# Patient Record
Sex: Female | Born: 1975 | Race: Black or African American | Hispanic: No | Marital: Single | Smoking: Never smoker
Health system: Southern US, Community
[De-identification: ages and names within clinical notes are randomized; demographics above are authoritative.]

## PROBLEM LIST (undated history)

## (undated) DIAGNOSIS — Z923 Personal history of irradiation: Secondary | ICD-10-CM

---

## 2003-10-10 ENCOUNTER — Ambulatory Visit (HOSPITAL_COMMUNITY): Admission: RE | Admit: 2003-10-10 | Discharge: 2003-10-10 | Payer: Self-pay | Admitting: Obstetrics & Gynecology

## 2008-01-25 HISTORY — PX: PARTIAL HYSTERECTOMY: SHX80

## 2008-01-25 HISTORY — PX: MYOMECTOMY: SHX85

## 2009-04-28 ENCOUNTER — Ambulatory Visit (HOSPITAL_COMMUNITY): Admission: RE | Admit: 2009-04-28 | Discharge: 2009-04-28 | Payer: Self-pay | Admitting: Family Medicine

## 2009-04-30 ENCOUNTER — Encounter: Admission: RE | Admit: 2009-04-30 | Discharge: 2009-04-30 | Payer: Self-pay | Admitting: Family Medicine

## 2009-07-01 ENCOUNTER — Ambulatory Visit: Payer: Self-pay | Admitting: Obstetrics & Gynecology

## 2009-08-06 ENCOUNTER — Ambulatory Visit: Payer: Self-pay | Admitting: Obstetrics and Gynecology

## 2009-08-31 ENCOUNTER — Ambulatory Visit (HOSPITAL_COMMUNITY): Admission: RE | Admit: 2009-08-31 | Discharge: 2009-08-31 | Payer: Self-pay | Admitting: Family Medicine

## 2009-09-02 ENCOUNTER — Ambulatory Visit: Payer: Self-pay | Admitting: Obstetrics & Gynecology

## 2009-10-19 ENCOUNTER — Inpatient Hospital Stay (HOSPITAL_COMMUNITY): Admission: RE | Admit: 2009-10-19 | Discharge: 2009-10-20 | Payer: Self-pay | Admitting: Obstetrics & Gynecology

## 2009-10-19 ENCOUNTER — Encounter: Payer: Self-pay | Admitting: Obstetrics & Gynecology

## 2009-10-19 ENCOUNTER — Ambulatory Visit: Payer: Self-pay | Admitting: Obstetrics & Gynecology

## 2009-11-20 ENCOUNTER — Ambulatory Visit: Payer: Self-pay | Admitting: Obstetrics & Gynecology

## 2010-02-15 ENCOUNTER — Encounter: Payer: Self-pay | Admitting: Family Medicine

## 2010-04-08 LAB — CBC
HCT: 34.8 % — ABNORMAL LOW (ref 36.0–46.0)
MCH: 27 pg (ref 26.0–34.0)
MCH: 28 pg (ref 26.0–34.0)
Platelets: 206 10*3/uL (ref 150–400)
Platelets: 285 10*3/uL (ref 150–400)
RBC: 4.23 MIL/uL (ref 3.87–5.11)
WBC: 3.7 10*3/uL — ABNORMAL LOW (ref 4.0–10.5)

## 2010-04-08 LAB — SURGICAL PCR SCREEN
MRSA, PCR: NEGATIVE
Staphylococcus aureus: NEGATIVE

## 2010-04-08 LAB — PREGNANCY, URINE: Preg Test, Ur: NEGATIVE

## 2010-09-06 IMAGING — US US PELVIS COMPLETE MODIFY
1 series · 14 of 25 positions shown · non-contrast
Comparison: October 10, 2003

CLINICAL DATA: Enlarged uterus and abnormal bleeding; expired IUD
with nonvisualization of strings

TRANSABDOMINAL AND TRANSVAGINAL ULTRASOUND OF PELVIS
TECHNIQUE: Both transabdominal and transvaginal ultrasound
examinations of the pelvis were performed including evaluation of
the uterus, ovaries, adnexal regions, and pelvic cul-de-sac.

[Series 1: us pelvis complete modify · 0.24mm/px · 14 of 61 slices shown]
[im 1/61]
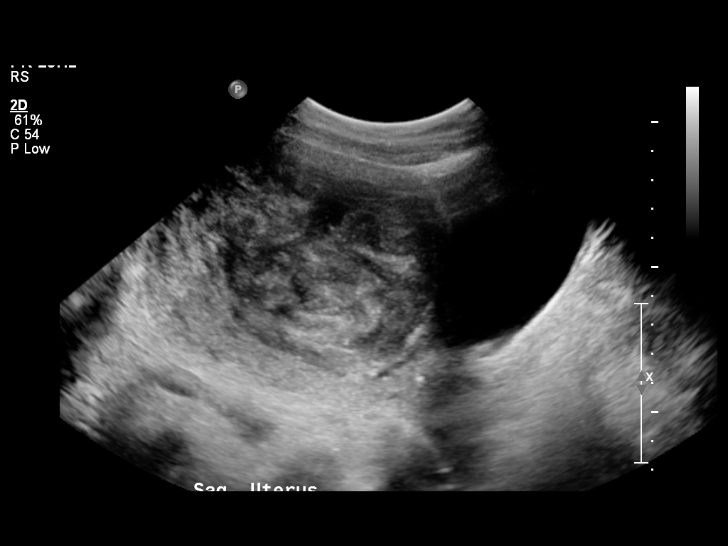
[im 6/61]
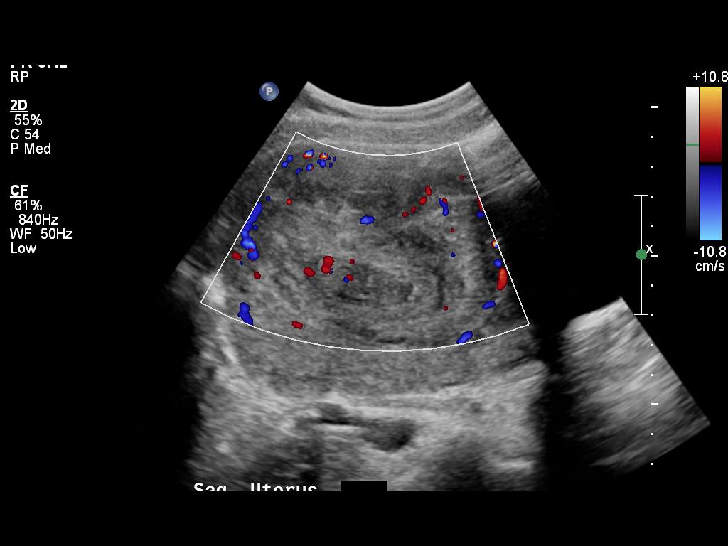
[im 11/61]
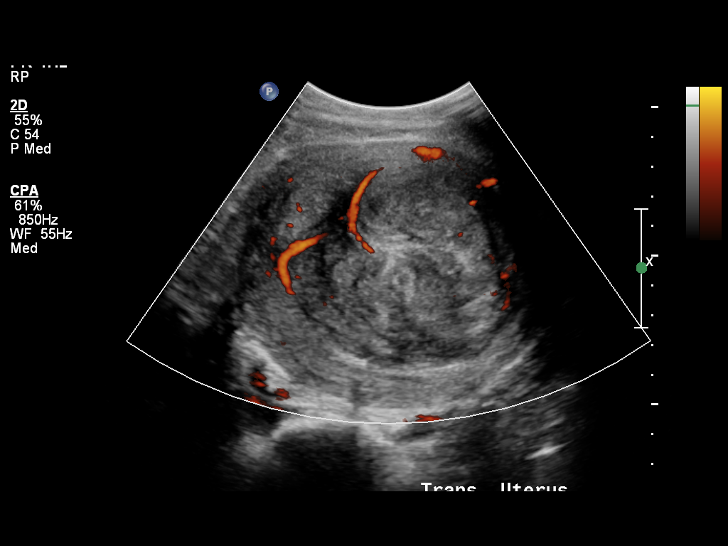
[im 16/61]
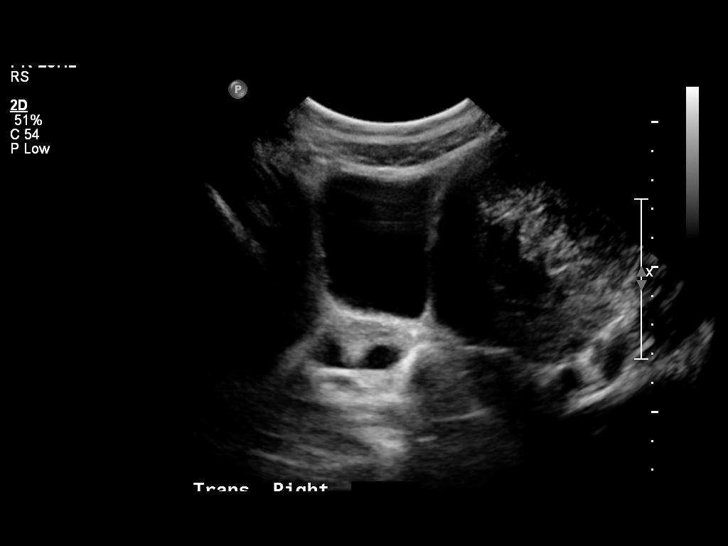
[im 21/61]
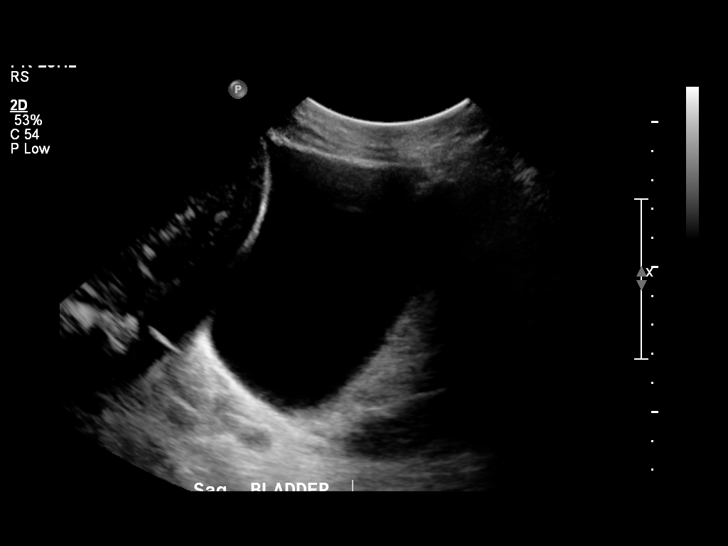
[im 23/61]
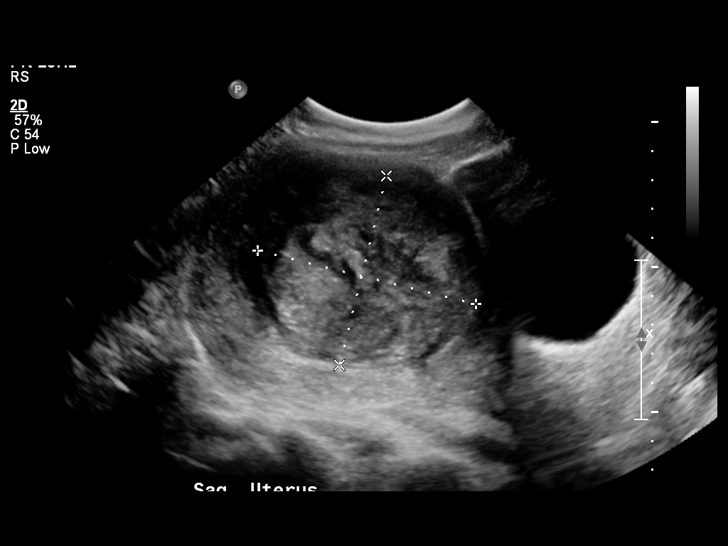
[im 28/61]
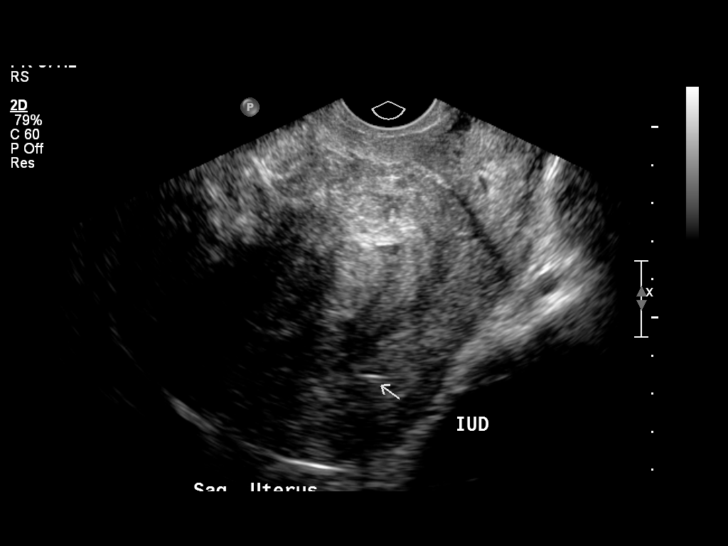
[im 33/61]
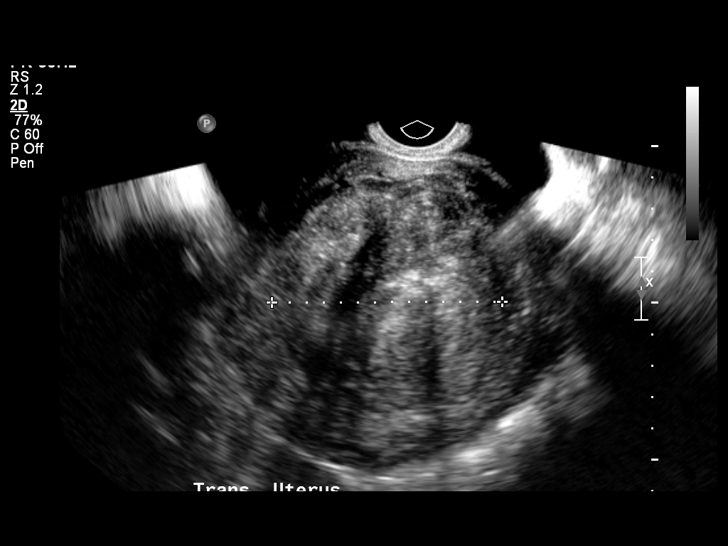
[im 38/61]
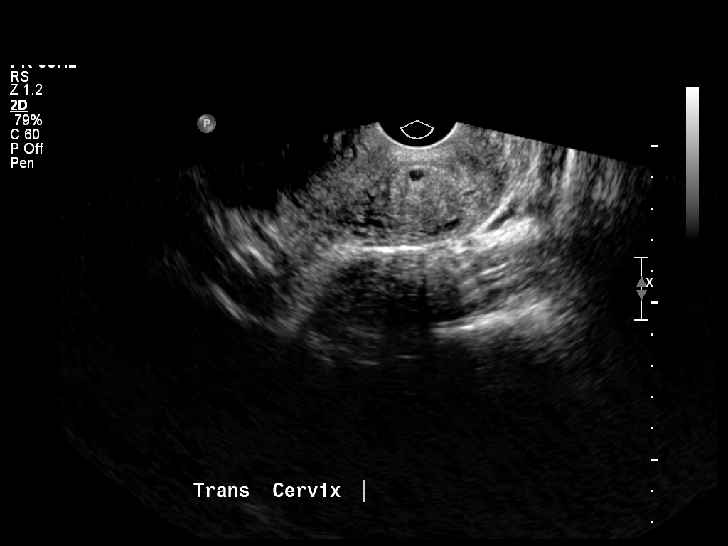
[im 41/61]
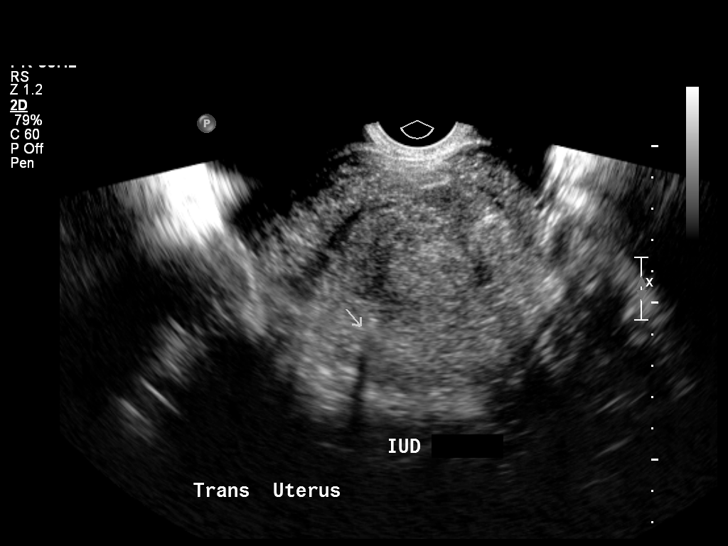
[im 46/61]
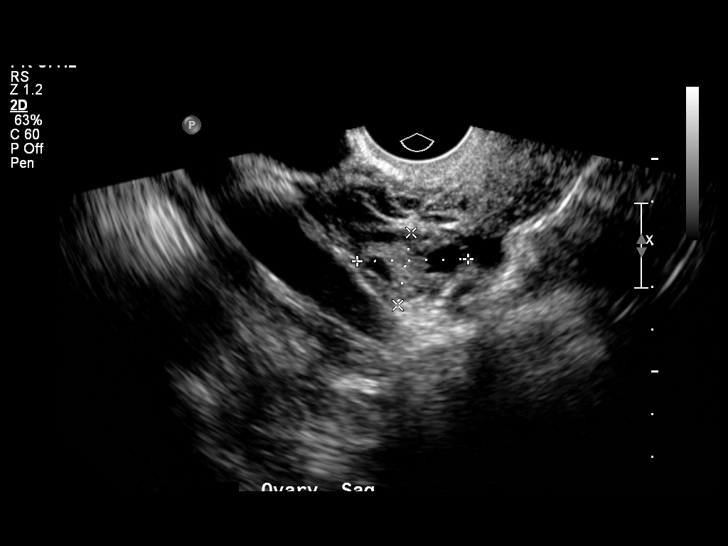
[im 51/61]
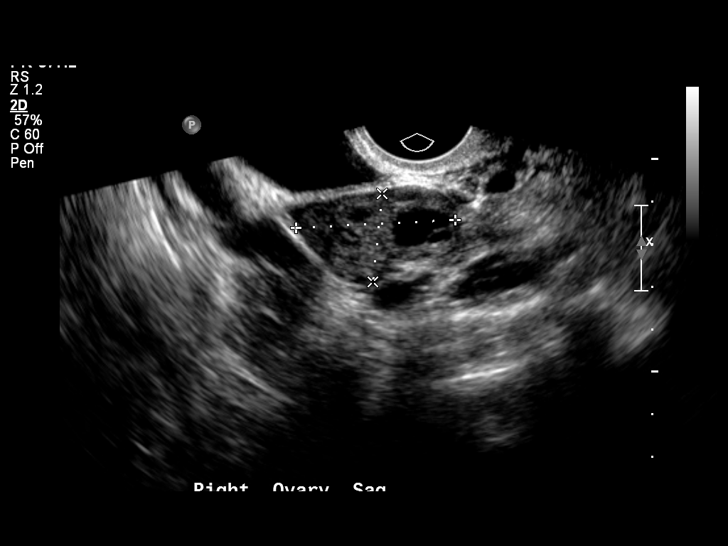
[im 56/61]
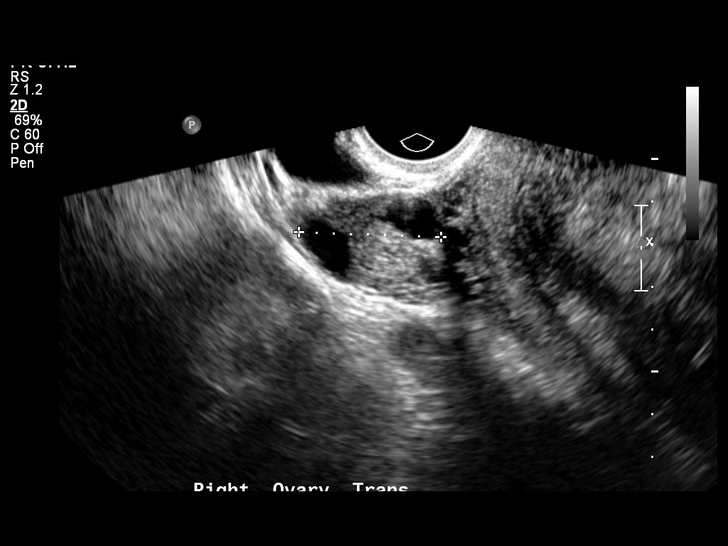
[im 61/61]
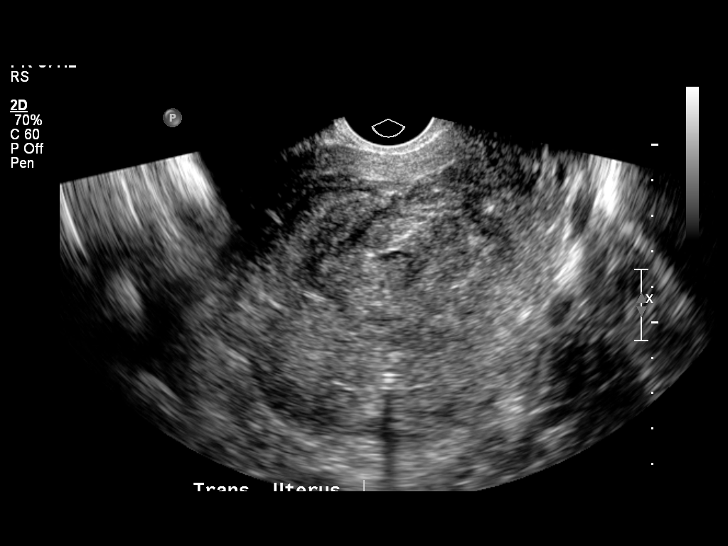

[14 of 25 positions shown; findings below may reference images not displayed]

FINDINGS: The uterus is enlarged, measuring 14.7 x 8.5 x 10.7 cm.
There is a large anterior myometrial fibroid which measures 8.7 cm
in dimension.  This displaces the endometrium posteriorly.  The
endometrium is difficult to see but the IUD is identified within
the endometrial cavity posterior to the large fibroid.

Both ovaries have a normal size and appearance.  The right ovary
measures 3.7 x 2.1 x 2.2 cm, and the left ovary measures 2.6 x
x 2.2 cm.  There are no adnexal masses or free pelvic fluid.
IMPRESSION: 8.7 cm anterior fundal myometrial fibroid.

The IUD is in proper location within the endometrial cavity which
is displaced posteriorly by the large fibroid.

## 2011-01-09 IMAGING — US US TRANSVAGINAL NON-OB
1 series · 13 of 25 positions shown · non-contrast
Comparison: 04/28/2009

CLINICAL DATA: Menorrhagia.  Uterine fibroids.  Retained IUD.

TRANSVAGINAL ULTRASOUND OF PELVIS
TECHNIQUE: Transvaginal ultrasound examination of the pelvis was
performed including evaluation of the uterus, ovaries, adnexal
regions, and pelvic cul-de-sac.

[Series 1: us transvaginal non-ob · 36 acquisitions, 13 frames shown]
[im 1/36]
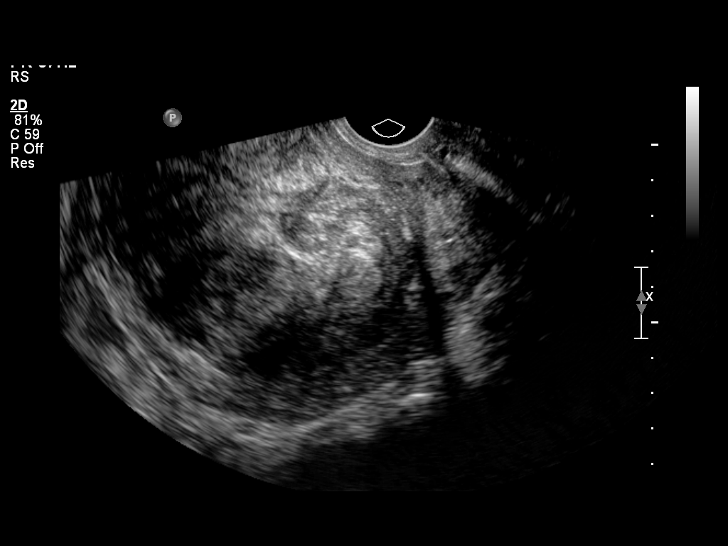
[im 3/36]
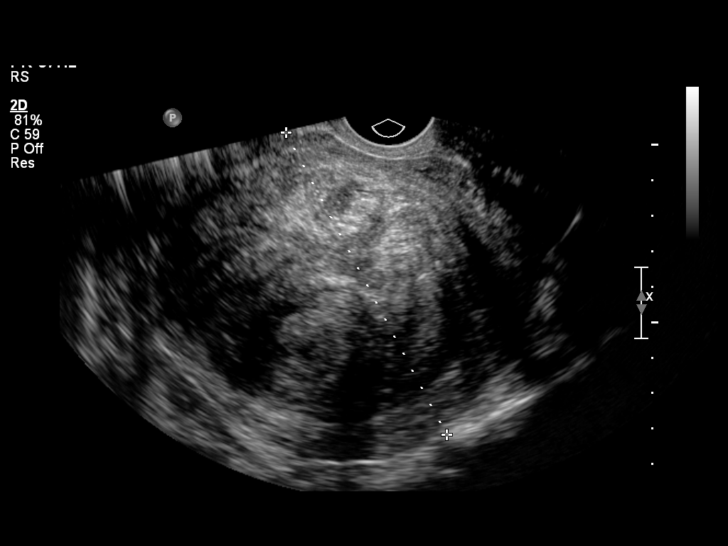
[im 6/36]
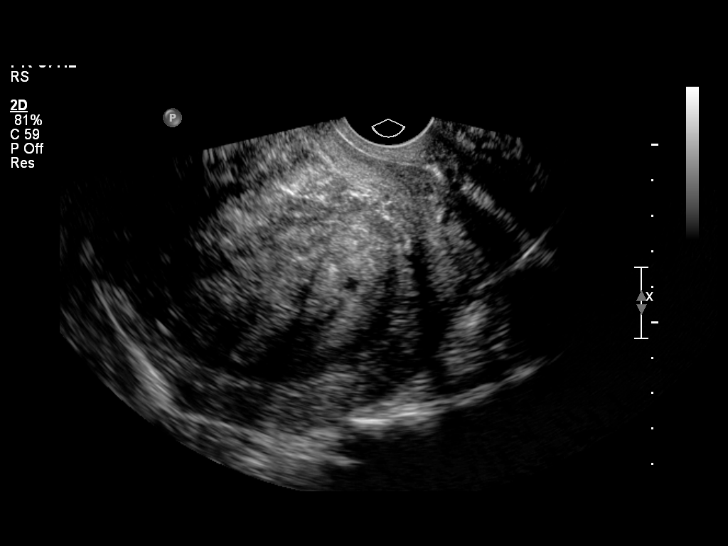
[im 9/36]
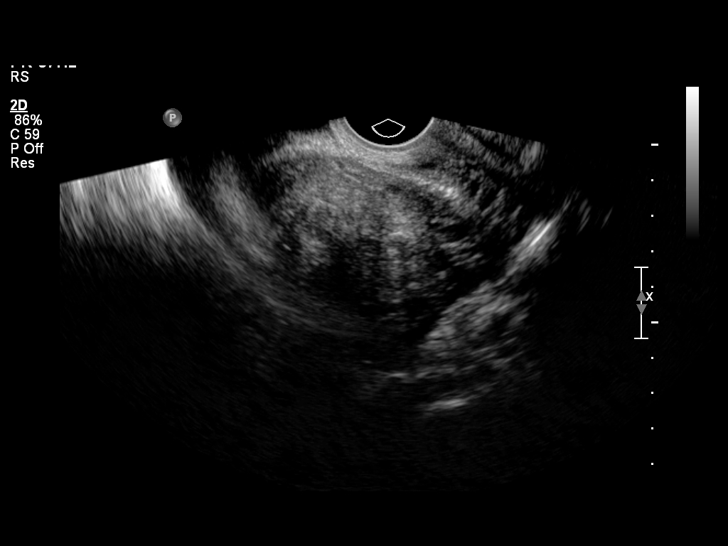
[im 12/36]
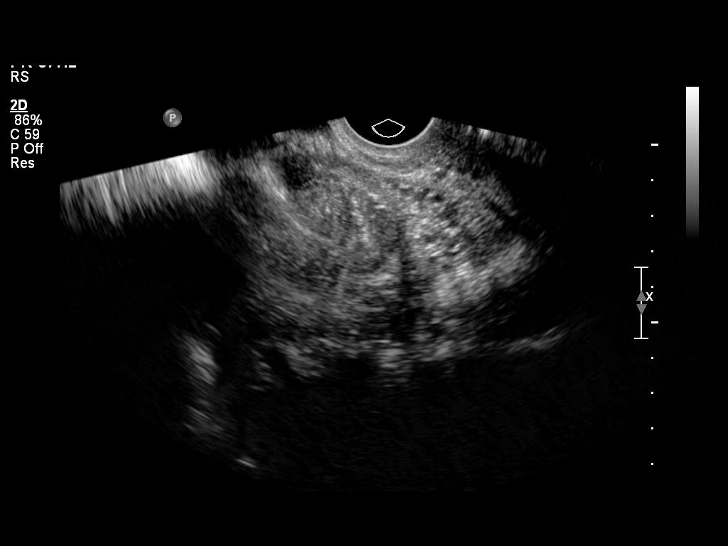
[im 15/36]
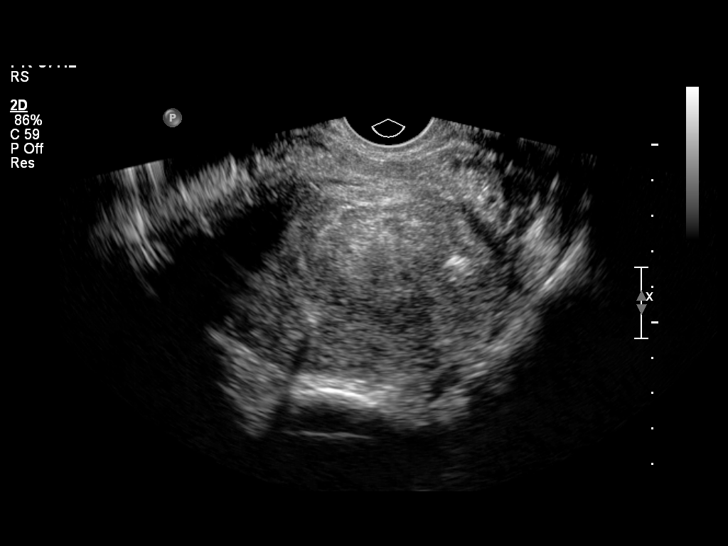
[im 18/36]
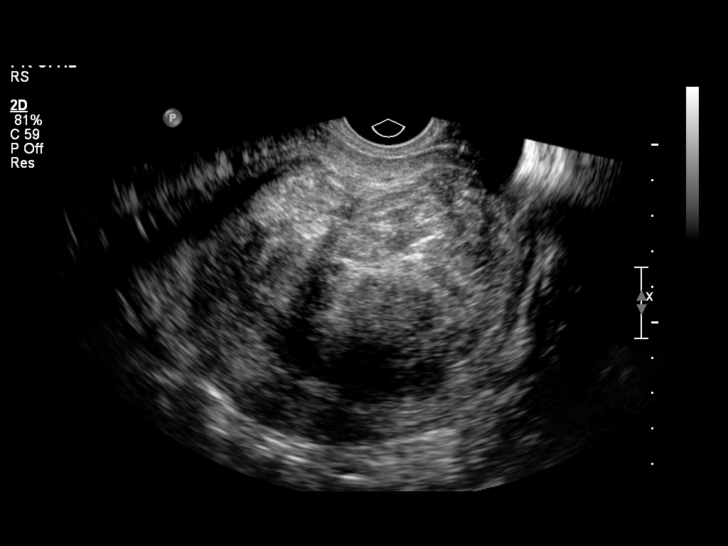
[im 21/36]
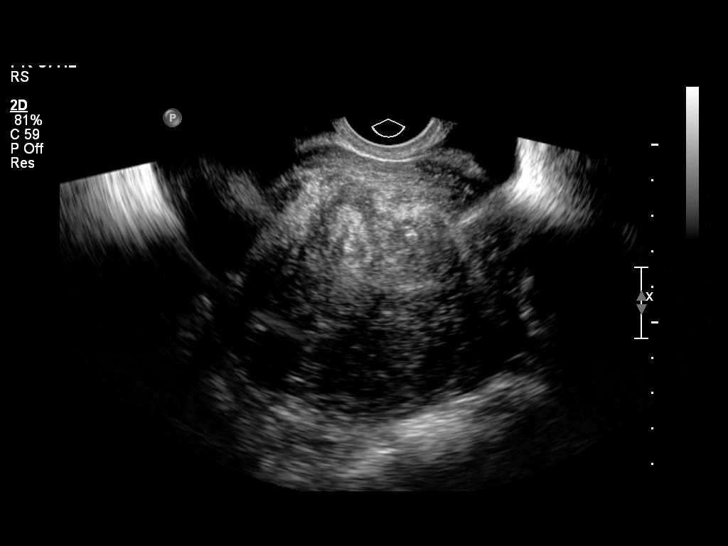
[im 24/36]
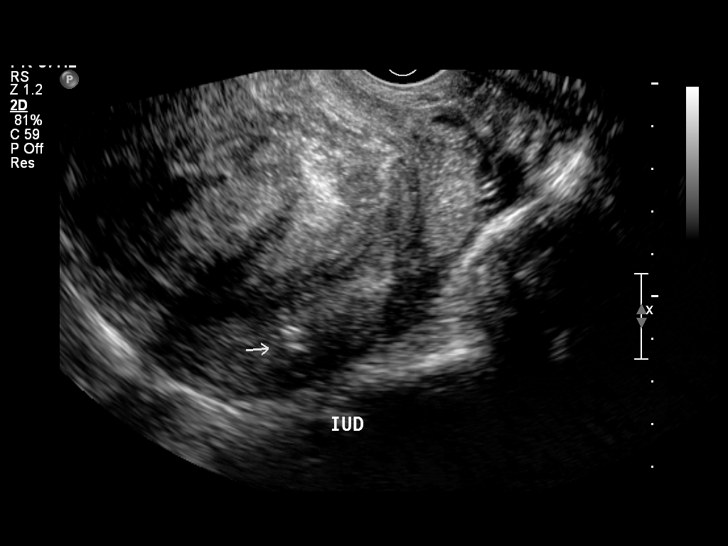
[im 27/36]
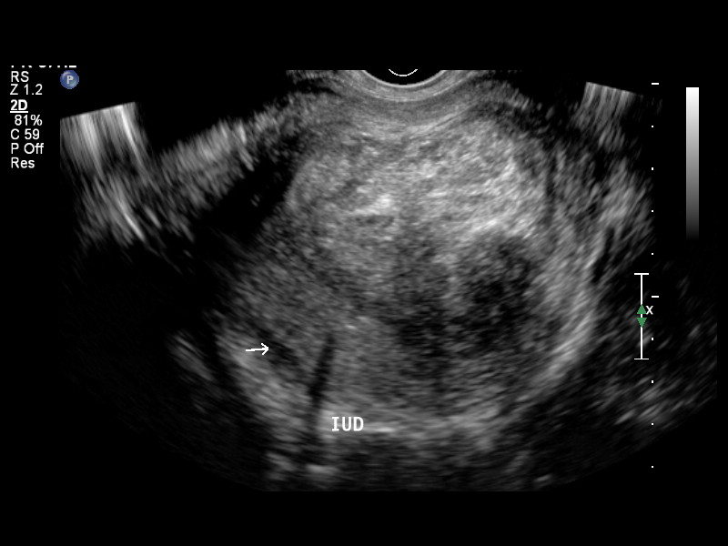
[im 30/36]
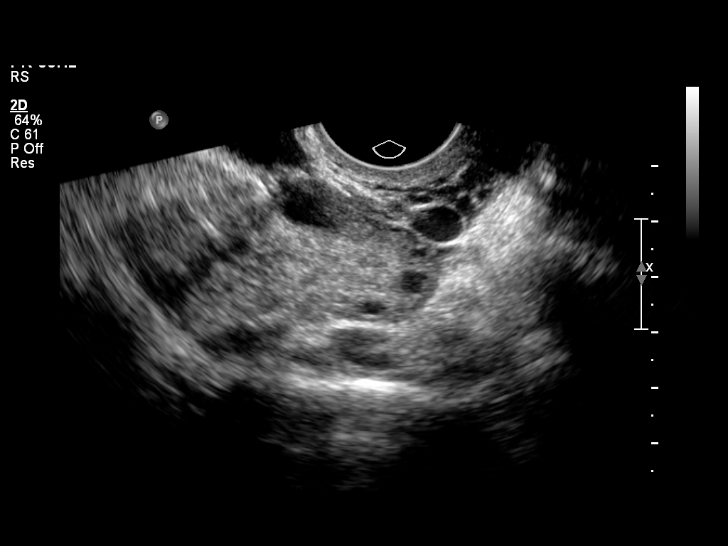
[im 33/36]
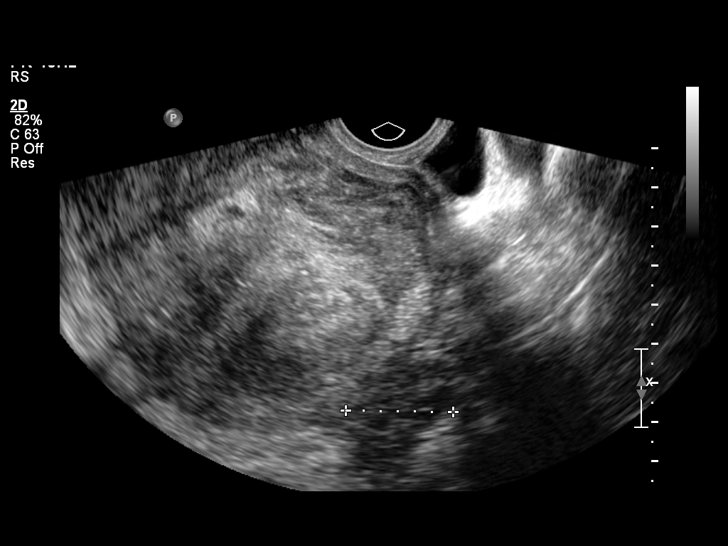
[im 36/36]
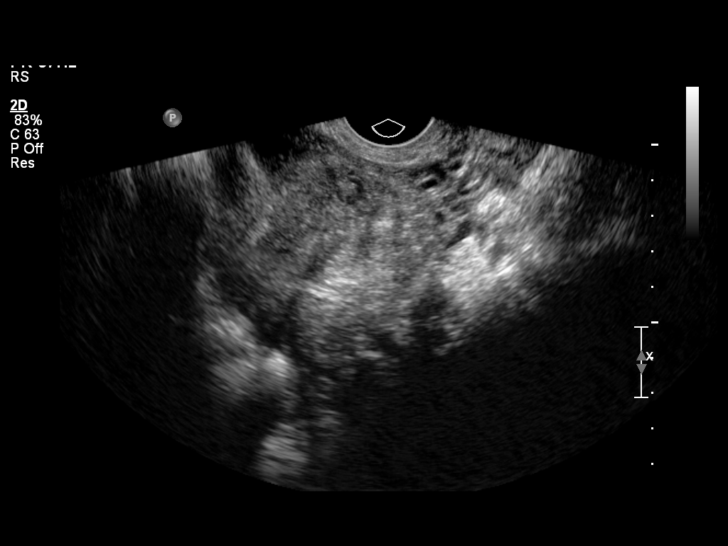

[13 of 25 positions shown; findings below may reference images not displayed]

FINDINGS: Uterus measures 13.1 x 9.6 x 10.1 cm.  A large anterior fibroid is
again seen measuring 8.4 x 7.6 x 8.4 cm.  This has a large central
submucosal component displacing the endometrium posteriorly.

Endometrium cannot be accurately measured due to displacement by
the large central fibroid described above the.  Visualization of
IUD is limited, however an IUD is again seen in the expected region
of the endometrial cavity along the posterior margin of the
submucosal fibroid.

Right Ovary measures 3.5 x 2.5 x 2.7 cm.  Normal appearance.

Left Ovary measures 4.4 x 2.6 x 2.7 cm.  Normal appearance.

Other Findings:  No other abnormality identified.
IMPRESSION: 1.  No significant change in large anterior uterine fibroid with
central submucosal component measuring 8.4 cm.
2.  IUD is seen in the endometrial cavity along the posterior
margin of this fibroid.
3.  Normal appearance of both ovaries.  No adnexal mass or free
fluid identified.

## 2013-12-30 ENCOUNTER — Other Ambulatory Visit: Payer: Self-pay | Admitting: Specialist

## 2013-12-30 DIAGNOSIS — R1030 Lower abdominal pain, unspecified: Secondary | ICD-10-CM

## 2013-12-30 DIAGNOSIS — R102 Pelvic and perineal pain: Secondary | ICD-10-CM

## 2014-01-02 ENCOUNTER — Other Ambulatory Visit: Payer: Self-pay

## 2014-01-03 ENCOUNTER — Ambulatory Visit
Admission: RE | Admit: 2014-01-03 | Discharge: 2014-01-03 | Disposition: A | Discharge status: Home / Self Care | Payer: No Typology Code available for payment source | Source: Ambulatory Visit | Attending: Specialist | Admitting: Specialist

## 2014-01-03 DIAGNOSIS — R1030 Lower abdominal pain, unspecified: Secondary | ICD-10-CM

## 2014-01-03 DIAGNOSIS — R102 Pelvic and perineal pain: Secondary | ICD-10-CM

## 2015-05-14 IMAGING — US US PELVIS COMPLETE
1 series · 14 of 25 positions shown · non-contrast
Comparison: 08/31/2009

CLINICAL DATA: Pelvic pain, right lower quadrant, for 2 months.
Prior hysterectomy.

EXAM:
TRANSABDOMINAL AND TRANSVAGINAL ULTRASOUND OF PELVIS
TECHNIQUE: Both transabdominal and transvaginal ultrasound examinations of the
pelvis were performed. Transabdominal technique was performed for
global imaging of the pelvis including uterus, ovaries, adnexal
regions, and pelvic cul-de-sac. It was necessary to proceed with
endovaginal exam following the transabdominal exam to visualize the
endometrium and ovaries.

[Series 1: us pelvis complete · 0.26mm/px · 14 of 33 slices shown]
[im 1/33]
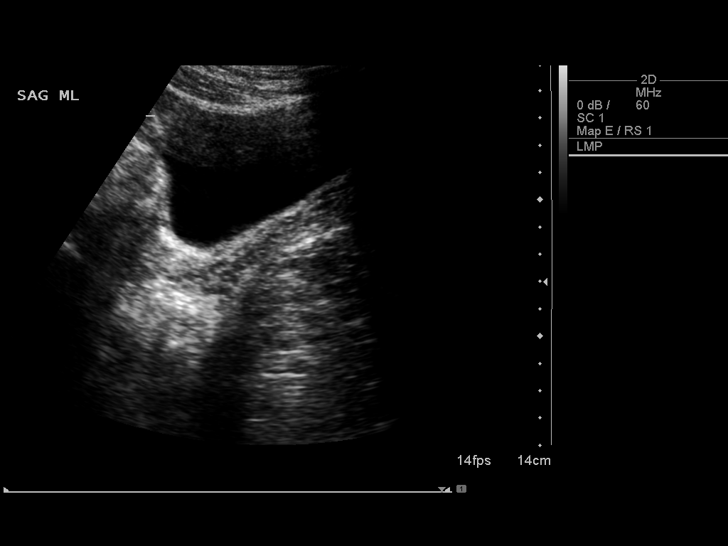
[im 3/33]
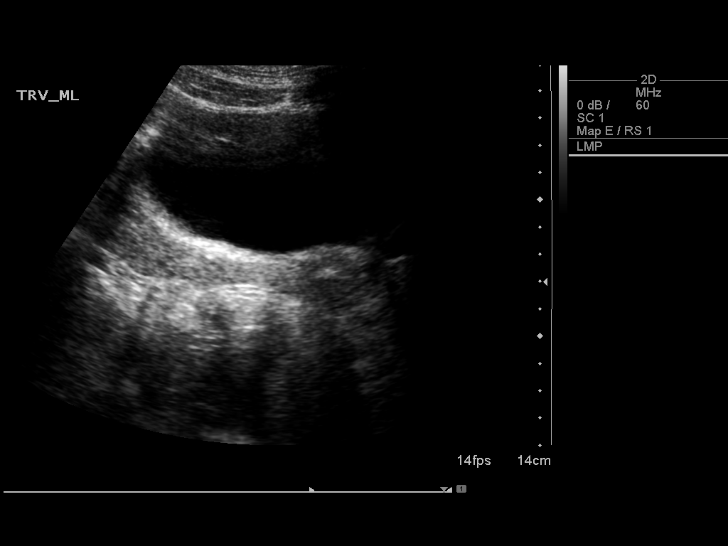
[im 6/33]
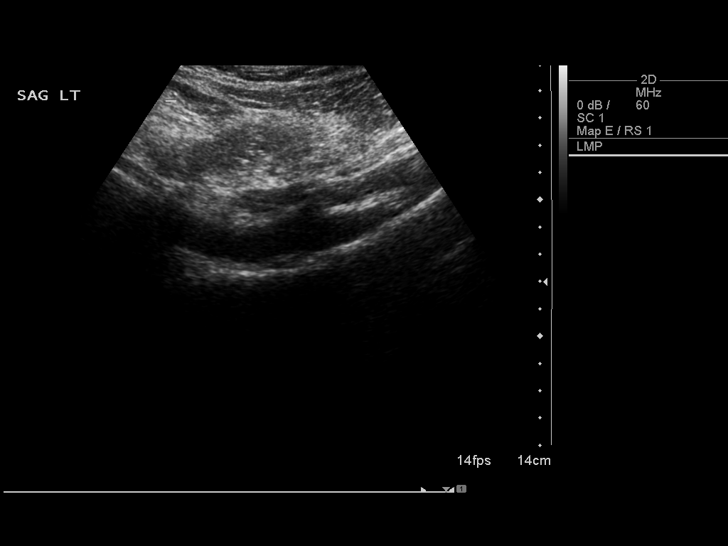
[im 9/33]
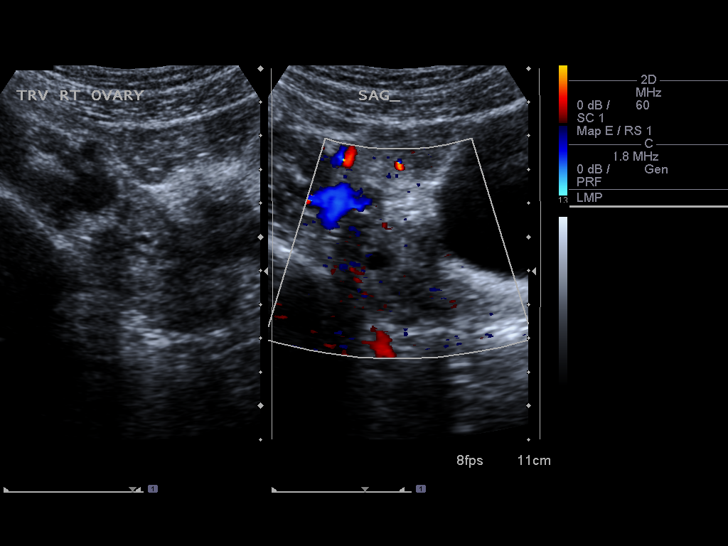
[im 11/33]
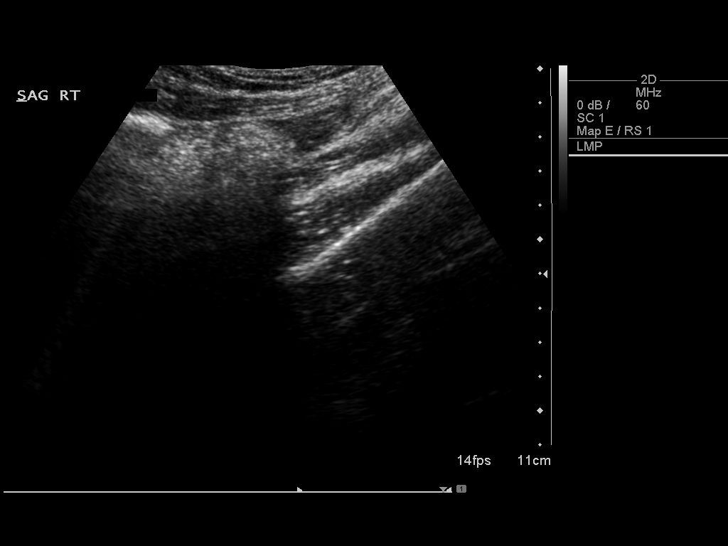
[im 13/33]
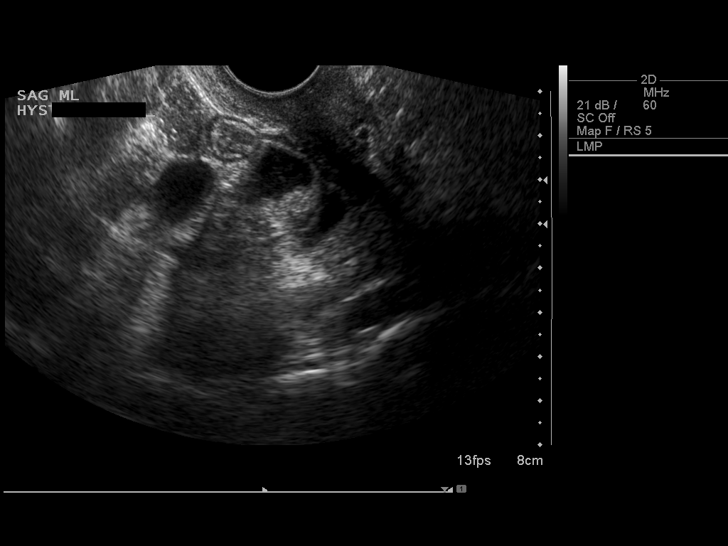
[im 15/33]
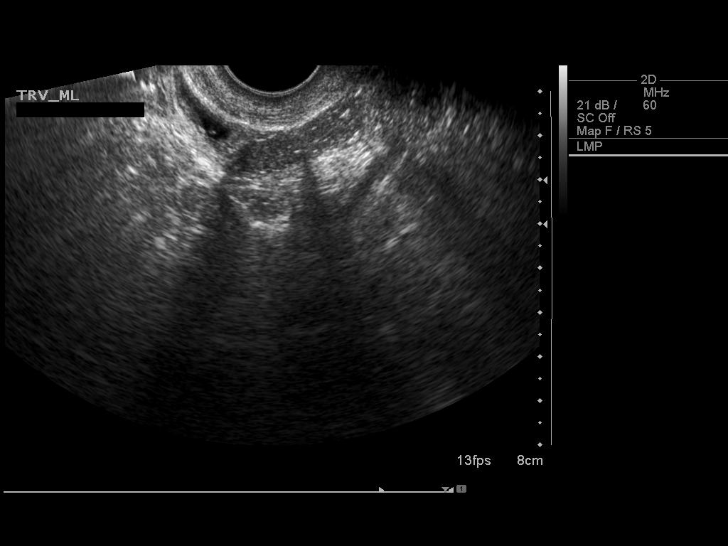
[im 18/33]
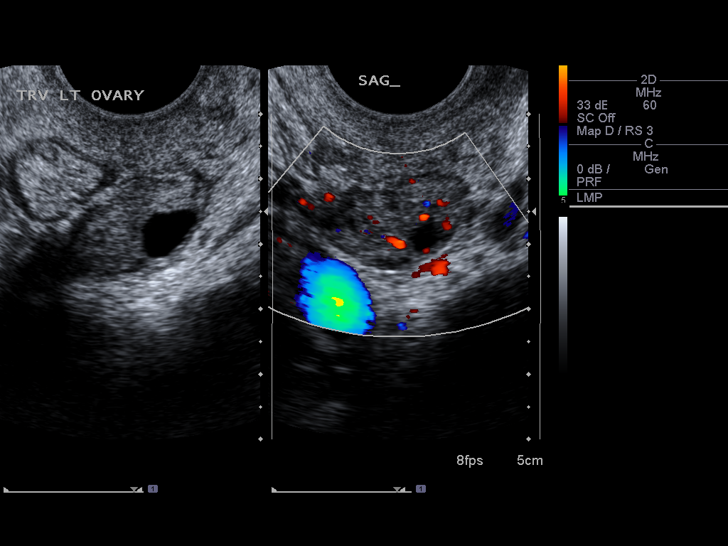
[im 21/33]
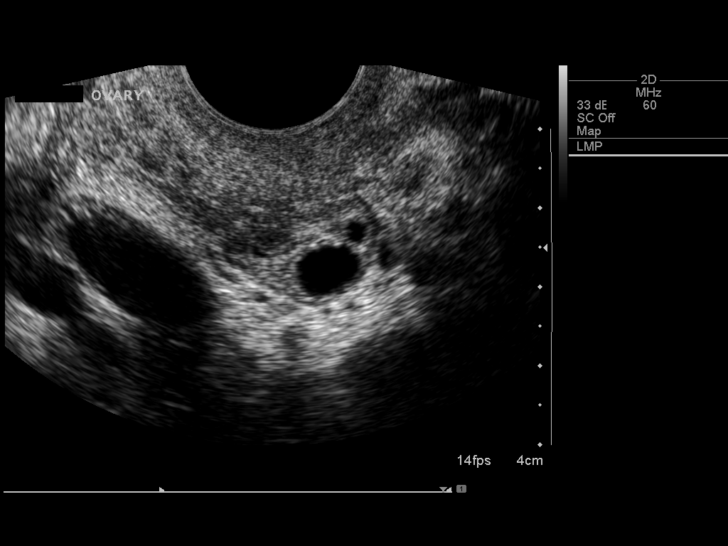
[im 22/33]
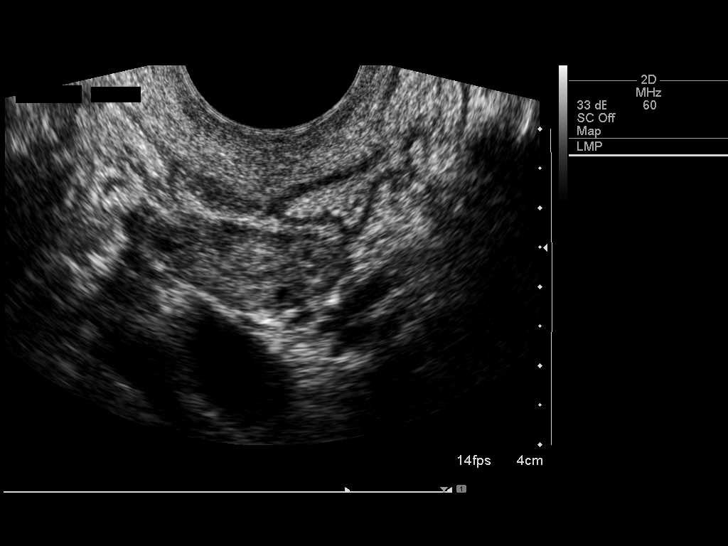
[im 25/33]
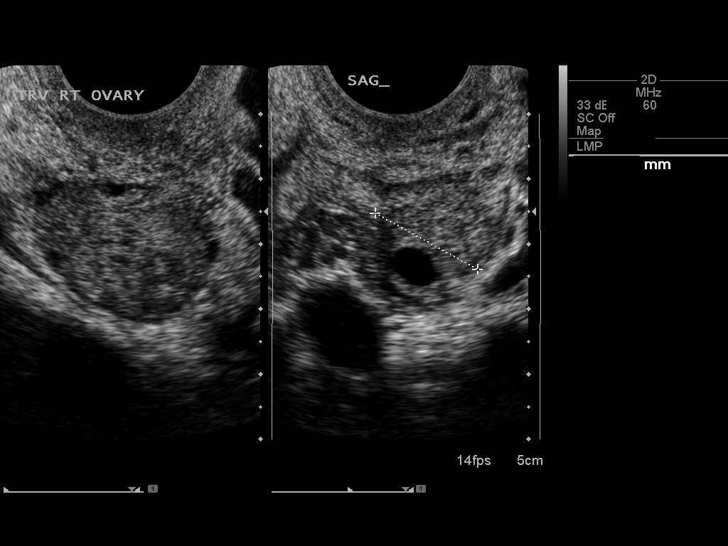
[im 27/33]
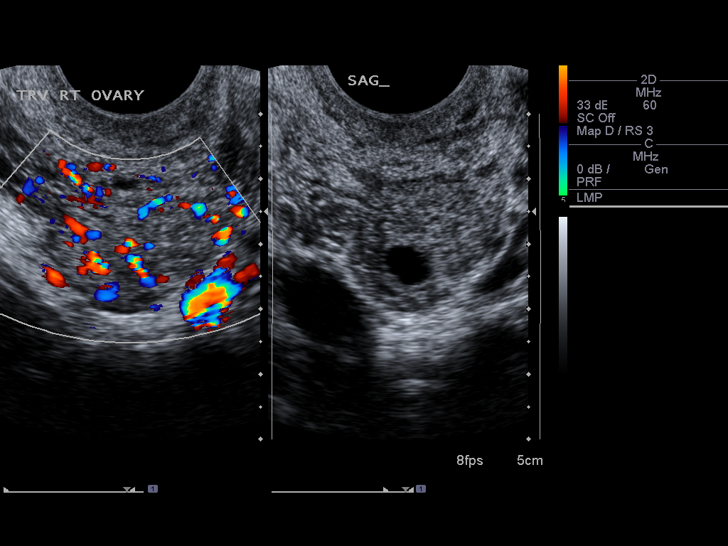
[im 30/33]
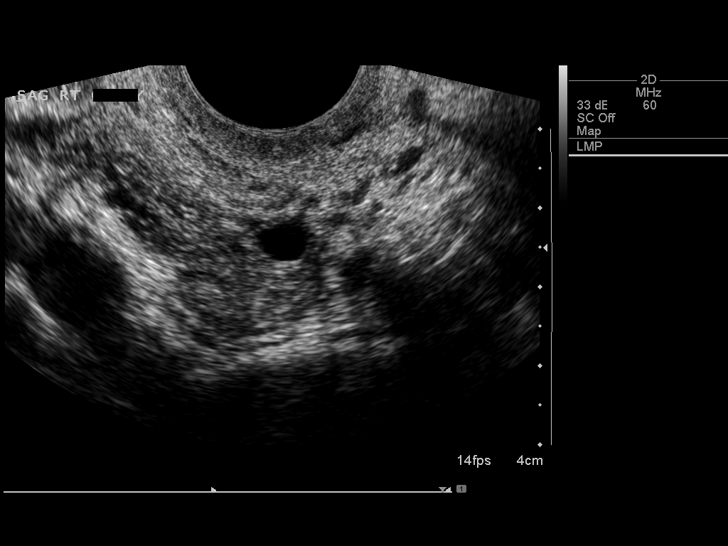
[im 33/33]
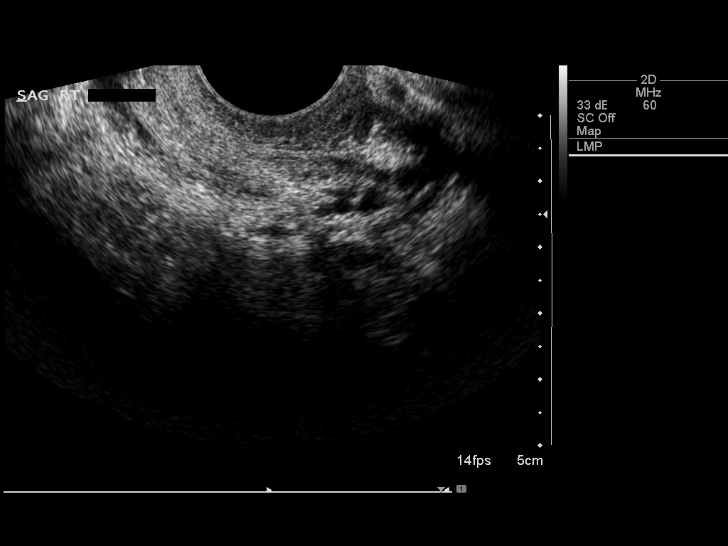

[14 of 25 positions shown; findings below may reference images not displayed]

FINDINGS: Uterus

Measurements: Surgically absent. No midline mass.

Endometrium

Thickness: Surgically absent.

Right ovary

Measurements: 2.8 x 1.9 x 1.8 cm. Normal appearance/no adnexal mass.

Left ovary

Measurements: 2.6 x 2.5 x 1.5 cm. Normal appearance/no adnexal mass.

Other findings

No free fluid.
IMPRESSION: Normal post hysterectomy exam.

## 2017-01-22 ENCOUNTER — Emergency Department (HOSPITAL_COMMUNITY)
Admission: EM | Admit: 2017-01-22 | Discharge: 2017-01-22 | Disposition: A | Discharge status: Home / Self Care | Payer: Self-pay | Attending: Emergency Medicine | Admitting: Emergency Medicine

## 2017-01-22 ENCOUNTER — Other Ambulatory Visit: Payer: Self-pay

## 2017-01-22 ENCOUNTER — Encounter (HOSPITAL_COMMUNITY): Payer: Self-pay | Admitting: *Deleted

## 2017-01-22 DIAGNOSIS — G5702 Lesion of sciatic nerve, left lower limb: Secondary | ICD-10-CM | POA: Insufficient documentation

## 2017-01-22 MED ORDER — NAPROXEN 500 MG PO TABS: 500.0000 mg | ORAL_TABLET | Freq: Two times a day (BID) | ORAL | 0 refills | Status: DC

## 2017-01-22 MED ORDER — METHOCARBAMOL 500 MG PO TABS: 500.0000 mg | ORAL_TABLET | Freq: Three times a day (TID) | ORAL | 0 refills | Status: DC | PRN

## 2017-01-22 MED ORDER — PREDNISONE 10 MG (21) PO TBPK: ORAL_TABLET | ORAL | 0 refills | Status: DC

## 2017-01-22 MED ORDER — PREDNISONE 20 MG PO TABS
60.0000 mg | ORAL_TABLET | Freq: Once | ORAL | Status: AC
  Administered 2017-01-22: 60 mg via ORAL
  Filled 2017-01-22: qty 3

## 2017-01-22 MED ORDER — NAPROXEN 250 MG PO TABS
500.0000 mg | ORAL_TABLET | Freq: Once | ORAL | Status: AC
  Administered 2017-01-22: 500 mg via ORAL
  Filled 2017-01-22: qty 2

## 2017-01-22 NOTE — ED Provider Notes (Signed)
[facility]   CSN: [id number] Arrival date & time: 01/22/17  [id number]     History   Chief Complaint Chief Complaint  Patient presents with  . Leg Pain    HPI Jodi Fleming is a 41 y.o. female without significant past medical history who presents to the emergency department complaining of left buttocks pain that radiates down her left lower extremity x 4 days.  States pain feels like a spasm that shoots down her leg.  States pain is progressively worsening.  Seems to be worse with certain movements and with sitting for long periods of time.  She has tried Tylenol, ibuprofen, and stretching as well as hot baths without relief.  States this morning she had a brief episode of tingling sensation in her fourth and fifth toes this resolved quickly and has not recurred.  No hx of injury or change in activity.  Denies numbness,  weakness, incontinence to bowel/bladder, fever, chills, IV drug use, or hx of cancer.  HPI  History reviewed. No pertinent past medical history.  There are no active problems to display for this patient.   Past Surgical History:  Procedure Laterality Date  . MYOMECTOMY  2010  . PARTIAL HYSTERECTOMY  2010    OB History    No data available       Home Medications    Prior to Admission medications   Not on File    Family History History reviewed. No pertinent family history.  Social History Social History   Tobacco Use  . Smoking status: Never Smoker  . Smokeless tobacco: Never Used  Substance Use Topics  . Alcohol use: No    Frequency: Never  . Drug use: No     Allergies   Patient has no known allergies.   Review of Systems Review of Systems  Constitutional: Negative for chills and fever.  Genitourinary: Negative for dysuria and hematuria.  Musculoskeletal: Negative for back pain and neck pain.       Left buttocks pain radiating down LLE  Neurological: Negative for weakness and  numbness.       Negative for incontinence    Physical Exam Updated Vital Signs BP 115/79 (BP Location: Right Arm)   Pulse 79   Temp 98.9 F (37.2 C) (Oral)   Resp 17   Ht 5\' 6"  (1.676 m)   Wt 62.6 kg (138 lb)   SpO2 100%   BMI 22.27 kg/m   Physical Exam  Constitutional: She appears well-developed and well-nourished. No distress.  HENT:  Head: Normocephalic and atraumatic.  Eyes: Conjunctivae are normal. Right eye exhibits no discharge. Left eye exhibits no discharge.  Cardiovascular:  Pulses:      Popliteal pulses are 2+ on the right side, and 2+ on the left side.  Musculoskeletal:  No midline cervical, thoracic, lumbar, or sacral tenderness to palpation. Patient with left buttocks tenderness to palpation with palpable muscle spasm. Patient has full ROM of all lower extremity joints. No bony tenderness to lower extremities.   Neurological:  Alert. Clear speech. Bilateral lower extremities' sensation grossly intact. 5/5 strength with plantar and dorsi flexion bilaterally. Patellar DTRs are 2+ and symmetric. Gait is antalgic, but intact, no foot drop noted. Symptoms reproduced with right lower extremity straight leg raise.   Psychiatric: She has a normal mood and affect. Her behavior is normal. Thought content normal.  Nursing note and vitals reviewed.   ED Treatments / Results  Labs (all labs ordered  are listed, but only abnormal results are displayed) Labs Reviewed - No data to display  EKG  EKG Interpretation None      Radiology No results found.  Procedures Procedures (including critical care time)  Medications Ordered in ED Medications  predniSONE (DELTASONE) tablet 60 mg (not administered)  naproxen (NAPROSYN) tablet 500 mg (not administered)     Initial Impression / Assessment and Plan / ED Course  I have reviewed the triage vital signs and the nursing notes.  Pertinent labs & imaging results that were available during my care of the patient were  reviewed by me and considered in my medical decision making (see chart for details).   Patient presents with left buttocks pain radiating down the left lower extremity.  Patient is nontoxic-appearing stable vital signs. Patient with no midline tenderness, she has tenderness to palpation with palpable muscle spasm in L buttocks area, symptoms reproduced with straight leg raise as well as with pulling left lower extremity to her chest and to her right.   No neurological deficits and normal neuro exam.  Patient can walk but states is painful, gait is antalgic without foot drop.  No loss of bowel or bladder control.  No concern for cauda equina.  No fever, night sweats, weight loss, h/o cancer, IVDU. Suspect piriformis syndrome. Will treat with prednisone, naproxen, and robaxin. Discussed with patient that she is not to drive or operate heavy machinery with robaxin. I discussed treatment plan, need for PCP follow-up, and return precautions with the patient. Provided opportunity for questions, patient confirmed understanding and is in agreement with plan.   Final Clinical Impressions(s) / ED Diagnoses   Final diagnoses:  Piriformis syndrome of left side    ED Discharge Orders        Ordered    predniSONE (STERAPRED UNI-PAK 21 TAB) 10 MG (21) TBPK tablet     01/22/17 1133    naproxen (NAPROSYN) 500 MG tablet  2 times daily     01/22/17 1133    methocarbamol (ROBAXIN) 500 MG tablet  Every 8 hours PRN     01/22/17 1133       Isauro Skelley, Shell PointSamantha R, PA-C 01/22/17 1140    Margarita Grizzleay, Danielle, MD 01/22/17 1242

## 2017-01-22 NOTE — ED Triage Notes (Signed)
Pt reports L leg muscle spasms x 4 days, worsened in the last 2 days.  She endorses numbness in her L 4th and 5th toes.  Pt is ambulatory with a limp, but with steady gait.  Denies injury.  She reports pain started in L hip.

## 2017-01-22 NOTE — Discharge Instructions (Signed)
You were seen in the emergency department for pain in your left buttocks down your left leg and were diagnosed with piriformis syndrome. Please see the attached handout for further information regarding this.   I have prescribed you multiple medications:   - Prednisone- this is a steroid, you will need to take this as prescribed, this will help with inflammation and pain.   - Naproxen is a nonsteroidal anti-inflammatory medication that will help with pain and swelling. Be sure to take this medication as prescribed with food, 1 pill every 12 hours,  It should be taken with food, as it can cause stomach upset, and more seriously, stomach bleeding. Do not take other nonsteroidal anti-inflammatory medications with this such as Advil, Motrin, or Aleve.   - Robaxin is the muscle relaxer I have prescribed, this is meant to help with muscle tightness. Be aware that this medication may make you drowsy therefore the first time you take this it should be at a time you are in an environment where you can rest. Do not drive or operate heavy machinery when taking this medication.   In addition you may also take Tylenol. Tylenol is generally safe, though you should not take more than 8 of the extra strength (500mg ) pills a day. You may ask your pharmacist where the topical lidoderm patches are these are patches you can place directly over areas of pain to sooth it.   The application of heat can help soothe the pain.  Maintaining your daily activities, including walking, is encourged, as it will help you get better faster than just staying in bed. Continue your stretches that we discussed.   Your pain should get better over the next 2 weeks.  You will need to follow up with  Your primary healthcare provider in 1 week for reassessment, if you do not have a primary care provider one is provided in your discharge instructions. However if you develop severe or worsening pain, fever, numbness, weakness, loss of bowel or  bladder control, or inability to walk or urinate, you should return to the ER immediately.  Please follow up with your doctor this week for a recheck if still having symptoms.

## 2019-07-24 ENCOUNTER — Ambulatory Visit: Payer: Managed Care, Other (non HMO) | Admitting: Podiatry

## 2022-05-24 ENCOUNTER — Telehealth: Payer: Self-pay | Admitting: Radiation Oncology

## 2022-05-25 ENCOUNTER — Other Ambulatory Visit: Payer: Self-pay | Admitting: Radiation Oncology

## 2022-05-25 ENCOUNTER — Inpatient Hospital Stay
Admission: RE | Admit: 2022-05-25 | Discharge: 2022-05-25 | Disposition: A | Discharge status: Home / Self Care | Payer: Self-pay | Source: Ambulatory Visit | Attending: Radiation Oncology | Admitting: Radiation Oncology

## 2022-05-25 DIAGNOSIS — D0512 Intraductal carcinoma in situ of left breast: Secondary | ICD-10-CM

## 2022-05-25 NOTE — Progress Notes (Signed)
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  LEFT MEDIAL BREAST, LUMPECTOMY:              Tumor histologic type:  Ductal carcinoma in situ (DCIS) Size (extent) of DCIS:                   Number of blocks examined: 10                  Number of blocks with DCIS: 3 Architectural patterns: Cribriform and micropapillary types Nuclear grade: 2 Necrosis: Not identified Microcalcifications: Present Margins: Uninvolved by DCIS Distance from closest margin: 7 mm to the anterior margin Regional lymph nodes:  Not sampled in this specimen. Pathologic stage classification (pTMN, AJCC 8th ed): pTis pNX Breast biomarker results: ([id number])     Estrogen Receptor: Positive (95%, strong intensity)     Progesterone Receptor: Positive (95%, strong intensity)  Additional pathologic findings:      Biopsy site changes     Fibrocystic changes.      Fibroadenoma.      Focal microcalcification associated with non-neoplastic breast tissue.   B.  ADDITIONAL SUPERIOR MARGIN, RESECTION:               Segment of breast tissue with focal columnar cell hyperplasia. No DCIS or invasive carcinoma is identified.    Electronically signed by Jodi Mall, MD on 05/24/2022 at 10:06 AM   Comment  Jodi Fleming [facility](CLIA# [id number])  Immunostains for p63 and SMM are performed with appropriate controls in the part A specimen, and reveal the presence of myoepithelial layer in DCIS and benign ducts.  04-07-22 Final Diagnosis   A.  TISSUE LABELED "RIGHT BREAST, 6:30, 2 CM FROM NIPPLE":              BENIGN FIBROEPITHELIAL LESION CONSISTENT WITH FIBROADENOMA WITH SCATTERED INTRADUCTAL MICROCALCIFICATIONS.              NO ATYPIA OR MALIGNANCY IDENTIFIED.   B.  TISSUE LABELED "RIGHT BREAST, 7:00, 2 CM FROM NIPPLE", CORE BIOPSIES:              BENIGN FIBROEPITHELIAL LESION CONSISTENT WITH FIBROADENOMA.              NO ATYPIA OR  MALIGNANCY IDENTIFIED.   C.  TISSUE LABELED "RIGHT BREAST, 5:30, 1 CM FROM NIPPLE", CORE BIOPSIES:              BENIGN FIBROEPITHELIAL LESION CONSISTENT WITH FIBROADENOMA.              FOCAL CALCIFICATIONS.              NO ATYPIA OR MALIGNANCY IDENTIFIED.    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  PLAN: I had a detailed discussion with Jodi Fleming today regarding this diagnosis. I reviewed her imaging personally and her pathology was reviewed  together today. I discussed with her the natural history of DCIS and the risks versus benefits of adjuvant radiotherapy in this setting. She understands that DCIS is preinvasive disease and is unlikely to impact her overall survival. The NCCN guidelines outline lumpectomy plus adjuvant radiotherapy to the breast as a level 1 recommendation as radiotherapy decreases risk of recurrence by 50%. I explained that half of all recurrences are typically invasive and that radiation treatments would reduce the risk of both preinvasive and invasive disease recurrences. However there are certain women who may do fine without the addition of adjuvant radiation therapy and there is a test that can assess the gene profile of the DCIS and estimate the recurrence rate with and without radiotherapy. The test predicts 10 year recurrence risk and can be utilized in young patients. However, at her age  adjuvant radiotherapy has been recommended. The risks, benefits, alternatives to and rationale of radiation therapy were explained in detail. We discussed the potential acute and late term side effects of radiotherapy in this setting. The patient had the opportunity to have all of her questions answered regarding her diagnosis and proposed treatment. Educational information provided to the patient, questions answered, and she had additional counseling by our nursing staff.  Genetic testing is pending but she is interested in proceeding with surgery as scheduled.   Past/Anticipated interventions by medical oncology, if any: Pt to see Dr.Gudena on 06-16-22  Lymphedema issues, if any:  none  Pain issues, if any: She reports sharp shooting, intermittent pains in breast at surgical site. Reports these are more mild in nature overall.   SAFETY ISSUES: Prior radiation? no Pacemaker/ICD? no Possible current pregnancy?no Is the patient on methotrexate? no  Current Complaints / other details:  Would like to start radiation this month if possible.

## 2022-05-31 ENCOUNTER — Telehealth: Payer: Self-pay

## 2022-05-31 ENCOUNTER — Encounter: Payer: Self-pay | Admitting: Radiation Oncology

## 2022-05-31 NOTE — Progress Notes (Signed)
Radiation Oncology         [phone] ________________________________  Initial Outpatient Consultation  Name: Jodi Fleming MRN: [id number]  Date: 06/01/2022  DOB: [birth year]  WU:[id number], No Pcp Per  [name], MD   REFERRING PHYSICIAN: [name], MD  DIAGNOSIS: The encounter diagnosis was Ductal carcinoma in situ (DCIS) of left breast.  Stage 0 (cTis (DCIS), cN0, cM0) Left Breast, Intermediate grade DCIS, ER+ / PR+ / Her2 not assessed: s/p lumpectomy   HISTORY OF PRESENT ILLNESS::Jodi Fleming is a 47 y.o. female who is seen as a courtesy of Dr. Jennet Maduro for an opinion concerning radiation therapy as part of management for her recently diagnosed left breast DCIS.   The patient presented for a routine screening mammogram on 03/10/22 which showed possible masses in the right breast as well as possible calcifications in the left breast. Diagnostic bilateral mammogram and bilateral breast ultrasound on 03/18/22 performed at [facility] further revealed: a group of indeterminate  microcalcifications in the medial posterior left breast measuring 5 x 4 x 5 mm, and 3 indeterminate oval hypoechoic masses in the 5:30-7 o'clock positions of the right breast, possibly representing fibroadenomas. No lymphadenopathy was appreciated in either axilla.   Biopsy of the medial left breast calcifications collected on 04/01/22 showed intermediate grade DCIS with associated microcalcifications. Prognostic indicators significant for: estrogen receptor 95% positive and progesterone receptor 95% positive, both with strong staining intensity; Her2 status not assessed.  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   Accordingly, the patient was referred to general surgery and opted to proceed with a left breast lumpectomy (without nodal biopsies) on 05/12/22 under the care of Dr. Jennet Maduro. Pathology  from the procedure revealed: intermediate grade DCIS; all margins negative for DCIS; margins status to in situ disease of 7 mm from the anterior margin. Prognostic indicators significant for: estrogen receptor 95% positive and progesterone receptor 95% positive, both with strong staining intensity; Her2 not assessed.   The patient has been seen by [facility] medical and radiation oncology (Dr. Donne Hazel). The indication of adjuvant radiation therapy based on her age was briefly reviewed with the patient on 05/10/22 by Dr. Donne Hazel.  The patient has also been seen by genetics at [facility].   PREVIOUS RADIATION THERAPY: No  PAST MEDICAL HISTORY: History reviewed. No pertinent past medical history.  PAST SURGICAL HISTORY: Past Surgical History:  Procedure Laterality Date   MYOMECTOMY  2010   PARTIAL HYSTERECTOMY  2010    FAMILY HISTORY: No family history on file.  SOCIAL HISTORY:  Social History   Tobacco Use   Smoking status: Never   Smokeless tobacco: Never  Substance Use Topics   Alcohol use: No   Drug use: No    ALLERGIES: No Known Allergies  MEDICATIONS:  Current Outpatient Medications  Medication Sig Dispense Refill   naproxen (NAPROSYN) 500 MG tablet Take 1 tablet (500 mg total) by mouth 2 (two) times daily. 30 tablet 0   methocarbamol (ROBAXIN) 500 MG tablet Take 1 tablet (500 mg total) by mouth every 8 (eight) hours as needed for muscle spasms. (Patient not taking: Reported on 05/31/2022) 21 tablet 0   predniSONE (STERAPRED UNI-PAK 21 TAB) 10 MG (21) TBPK tablet 6, 5, 4, 3, 2, 1 Take as written (Patient not taking: Reported on 05/31/2022) 21 tablet 0   No current facility-administered medications for this encounter.    REVIEW OF SYSTEMS:  A 10+ POINT REVIEW OF SYSTEMS WAS OBTAINED including neurology, dermatology, psychiatry, cardiac, respiratory, lymph, extremities, GI, GU, musculoskeletal, constitutional, reproductive, HEENT.  She reports some sensitivity and soreness in  the breast but no actual pain.  She denies any drainage from the breast nipple discharge or bleeding.   PHYSICAL EXAM:  height is 5\' 6"  (1.676 m) and weight is 144 lb 3.2 oz (65.4 kg). Her temperature is 98.7 F (37.1 C). Her blood pressure is 93/65 and her pulse is 66. Her respiration is 18 and oxygen saturation is 100%.   General: Alert and oriented, in no acute distress HEENT: Head is normocephalic. Extraocular movements are intact.  Neck: Neck is supple, no palpable cervical or supraclavicular lymphadenopathy. Heart: Regular in rate and rhythm with no murmurs, rubs, or gallops. Chest: Clear to auscultation bilaterally, with no rhonchi, wheezes, or rales. Abdomen: Soft, nontender, nondistended, with no rigidity or guarding. Extremities: No cyanosis or edema. Lymphatics: see Neck Exam Skin: No concerning lesions. Musculoskeletal: symmetric strength and muscle tone throughout. Neurologic: Cranial nerves II through XII are grossly intact. No obvious focalities. Speech is fluent. Coordination is intact. Psychiatric: Judgment and insight are intact. Affect is appropriate.  Right Breast: no palpable mass, nipple discharge or bleeding. Left Breast: Well-healing lumpectomy scar in the upper inner aspect.  No signs of drainage or infection.  Induration superior to the lumpectomy scar consistent with possible seroma.  ECOG = 1  0 - Asymptomatic (Fully active, able to carry on all predisease activities without restriction)  1 - Symptomatic but completely ambulatory (Restricted in physically strenuous activity but ambulatory and able to carry out work of a light or sedentary nature. For example, light housework, office work)  2 - Symptomatic, <50% in bed during the day (Ambulatory and capable of all self care but unable to carry out any work activities. Up and about more than 50% of waking hours)  3 - Symptomatic, >50% in bed, but not bedbound (Capable of only limited self-care, confined to bed or  chair 50% or more of waking hours)  4 - Bedbound (Completely disabled. Cannot carry on any self-care. Totally confined to bed or chair)  5 - Death   Santiago Glad MM, Creech RH, Tormey DC, et al. [id number]). "Toxicity and response criteria of the [facility]". Am. Evlyn Clines. Oncol. 5 (6): 649-55  LABORATORY DATA:  Lab Results  Component Value Date   WBC 7.3 10/20/2009   HGB 9.8 (L) 10/20/2009   HCT 28.9 (L) 10/20/2009   MCV 82.7 10/20/2009   PLT 206 10/20/2009   No results found for: "NA", "K", "CL", "CO2", "GLUCOSE", "BUN", "CREATININE", "CALCIUM"    RADIOGRAPHY: No results found.    IMPRESSION: Stage 0 (cTis (DCIS), cN0, cM0) Left Breast, Intermediate grade DCIS, ER+ / PR+ / Her2 not assessed: s/p lumpectomy  She would be a good candidate for adjuvant radiation therapy to reduce chances of recurrence within the breast.  Today, I talked to the patient  about the findings and work-up thus far.  We discussed the natural history of intraductal carcinoma of the breast and general treatment, highlighting the role of radiotherapy in the management.  We discussed the available radiation techniques, and focused on the details of logistics and delivery.  We reviewed the anticipated acute and late sequelae associated with radiation in this setting.  The patient was encouraged to ask questions that I answered to the best of my ability.  A patient consent form was discussed and signed.  We  retained a copy for our records.  The patient would like to proceed with radiation and will be scheduled for CT simulation.  PLAN: She will return for CT simulation on May 14 with treatment to begin approximately a week later.  She would be a good candidate for hypofractionated accelerated radiation therapy over approximately 4 weeks.  Will use cardiac sparing techniques if necessary.   60 minutes of total time was spent for this patient encounter, including preparation, face-to-face counseling with the  patient and coordination of care, physical exam, and documentation of the encounter.   ------------------------------------------------  Billie Lade, PhD, MD  This document serves as a record of services personally performed by Antony Blackbird, MD. It was created on his behalf by Neena Rhymes, a trained medical scribe. The creation of this record is based on the scribe's personal observations and the provider's statements to them. This document has been checked and approved by the attending provider.

## 2022-05-31 NOTE — Telephone Encounter (Signed)
RN called pt to obtain nurse evaluation information. Consult note complete and routed to Dr. Roselind Messier.

## 2022-05-31 NOTE — Telephone Encounter (Signed)
Rn called to obtain nurse evaluation information for consultation tomorrow. However pt did not have time of call to answer questions. She stated she would call back in 30 minutes. Rn awaiting call back from pt.

## 2022-06-01 ENCOUNTER — Other Ambulatory Visit: Payer: Self-pay

## 2022-06-01 ENCOUNTER — Ambulatory Visit
Admission: RE | Admit: 2022-06-01 | Discharge: 2022-06-01 | Disposition: A | Discharge status: Home / Self Care | Payer: Managed Care, Other (non HMO) | Source: Ambulatory Visit | Attending: Radiation Oncology | Admitting: Radiation Oncology

## 2022-06-01 VITALS — BP 93/65 | HR 66 | Temp 98.7°F | Resp 18 | Ht 66.0 in | Wt 144.2 lb

## 2022-06-01 DIAGNOSIS — D0512 Intraductal carcinoma in situ of left breast: Secondary | ICD-10-CM

## 2022-06-06 ENCOUNTER — Ambulatory Visit
Admission: RE | Admit: 2022-06-06 | Discharge: 2022-06-06 | Disposition: A | Discharge status: Home / Self Care | Payer: Managed Care, Other (non HMO) | Source: Ambulatory Visit | Attending: Radiation Oncology | Admitting: Radiation Oncology

## 2022-06-06 DIAGNOSIS — D0512 Intraductal carcinoma in situ of left breast: Secondary | ICD-10-CM

## 2022-06-06 DIAGNOSIS — Z510 Encounter for antineoplastic radiation therapy: Secondary | ICD-10-CM | POA: Insufficient documentation

## 2022-06-07 DIAGNOSIS — Z510 Encounter for antineoplastic radiation therapy: Secondary | ICD-10-CM | POA: Diagnosis not present

## 2022-06-16 ENCOUNTER — Other Ambulatory Visit: Payer: Self-pay

## 2022-06-16 ENCOUNTER — Ambulatory Visit
Admission: RE | Admit: 2022-06-16 | Discharge: 2022-06-16 | Disposition: A | Discharge status: Home / Self Care | Payer: Managed Care, Other (non HMO) | Source: Ambulatory Visit | Attending: Radiation Oncology | Admitting: Radiation Oncology

## 2022-06-16 ENCOUNTER — Inpatient Hospital Stay: Payer: Managed Care, Other (non HMO) | Admitting: Hematology and Oncology

## 2022-06-16 ENCOUNTER — Inpatient Hospital Stay: Payer: Managed Care, Other (non HMO)

## 2022-06-16 VITALS — BP 103/64 | HR 62 | Temp 97.9°F | Wt 145.6 lb

## 2022-06-16 DIAGNOSIS — D0512 Intraductal carcinoma in situ of left breast: Secondary | ICD-10-CM

## 2022-06-16 DIAGNOSIS — Z510 Encounter for antineoplastic radiation therapy: Secondary | ICD-10-CM | POA: Diagnosis not present

## 2022-06-16 DIAGNOSIS — Z90710 Acquired absence of both cervix and uterus: Secondary | ICD-10-CM | POA: Insufficient documentation

## 2022-06-16 LAB — CMP (CANCER CENTER ONLY)
ALT: 12 U/L (ref 0–44)
AST: 14 U/L — ABNORMAL LOW (ref 15–41)
Albumin: 4.5 g/dL (ref 3.5–5.0)
Alkaline Phosphatase: 47 U/L (ref 38–126)
Anion gap: 5 (ref 5–15)
BUN: 11 mg/dL (ref 6–20)
CO2: 28 mmol/L (ref 22–32)
Calcium: 9.2 mg/dL (ref 8.9–10.3)
Chloride: 106 mmol/L (ref 98–111)
Creatinine: 0.92 mg/dL (ref 0.44–1.00)
GFR, Estimated: >60 mL/min (ref >60)
Glucose, Bld: 99 mg/dL (ref 70–99)
Potassium: 4.5 mmol/L (ref 3.5–5.1)
Sodium: 139 mmol/L (ref 135–145)
Total Bilirubin: 1.2 mg/dL (ref 0.3–1.2)
Total Protein: 7 g/dL (ref 6.5–8.1)

## 2022-06-16 LAB — CBC WITH DIFFERENTIAL (CANCER CENTER ONLY)
Abs Immature Granulocytes: 0 10*3/uL (ref 0.00–0.07)
Basophils Absolute: 0 10*3/uL (ref 0.0–0.1)
Basophils Relative: 0 %
Eosinophils Absolute: 0.1 10*3/uL (ref 0.0–0.5)
Eosinophils Relative: 3 %
HCT: 37 % (ref 36.0–46.0)
Hemoglobin: 12.2 g/dL (ref 12.0–15.0)
Immature Granulocytes: 0 %
Lymphocytes Relative: 47 %
Lymphs Abs: 1.7 10*3/uL (ref 0.7–4.0)
MCH: 29.3 pg (ref 26.0–34.0)
MCHC: 33 g/dL (ref 30.0–36.0)
MCV: 88.9 fL (ref 80.0–100.0)
Monocytes Absolute: 0.3 10*3/uL (ref 0.1–1.0)
Monocytes Relative: 8 %
Neutro Abs: 1.5 10*3/uL — ABNORMAL LOW (ref 1.7–7.7)
Neutrophils Relative %: 42 %
Platelet Count: 270 10*3/uL (ref 150–400)
RBC: 4.16 MIL/uL (ref 3.87–5.11)
RDW: 12.1 % (ref 11.5–15.5)
WBC Count: 3.6 10*3/uL — ABNORMAL LOW (ref 4.0–10.5)
nRBC: 0 % (ref 0.0–0.2)

## 2022-06-16 LAB — RAD ONC ARIA SESSION SUMMARY
Course Elapsed Days: 0
Plan Fractions Treated to Date: 1
Plan Prescribed Dose Per Fraction: 2.67 Gy
Plan Total Fractions Prescribed: 15
Plan Total Prescribed Dose: 40.05 Gy
Reference Point Dosage Given to Date: 2.67 Gy
Reference Point Session Dosage Given: 2.67 Gy
Session Number: 1

## 2022-06-16 NOTE — Progress Notes (Signed)
Orders placed per MD

## 2022-06-16 NOTE — Assessment & Plan Note (Addendum)
03/10/2022: Screening mammogram detected left breast mass and calcifications 5 mm and 3 indeterminate masses (possibly fibroadenomas) [facility]): Biopsy: Intermediate grade DCIS ER 95%, PR 95% (right breast masses biopsy 04/07/2022: Benign)  05/12/2022: Left lumpectomy with Dr. Jennet Maduro: Intermediate grade DCIS, margins negative, ER 95%, PR 95%  Pathology counseling: I discussed the final pathology report of the patient provided  a copy of this report. I discussed the margins.  We also discussed the final staging along with previously performed ER/PR testing.  Treatment plan: Adjuvant radiation therapy with Dr. Roselind Messier Adjuvant antiestrogen therapy with tamoxifen once daily x 5 years  Return to clinic after radiation is complete

## 2022-06-16 NOTE — Progress Notes (Signed)
[facility] CONSULT NOTE  Patient Care Team: Patient, No Pcp Per as PCP - General (General Practice)  CHIEF COMPLAINTS/PURPOSE OF CONSULTATION:  Newly diagnosed breast cancer  HISTORY OF PRESENTING ILLNESS:  Jodi Fleming 47 y.o. female is here because of recent diagnosis of left breast DCIS.  Patient presented with a screening mammogram that detected calcifications in the left breast which on biopsy came back as intermediate grade DCIS that was ER/PR positive.  There was also right breast masses which on biopsy were benign.  She underwent left lumpectomy at [facility] on 05/12/2022 and the final pathology confirmed intermediate grade DCIS margins negative that was ER/PR positive.  She was seen by radiation oncology and she is coming to see [place] to discuss adjuvant treatment options.  I reviewed her records extensively and collaborated the history with the patient.  SUMMARY OF ONCOLOGIC HISTORY: Oncology History  Ductal carcinoma in situ (DCIS) of left breast  03/10/2022 Initial Diagnosis   03/10/2022: Screening mammogram detected left breast mass and calcifications 5 mm and 3 indeterminate masses (possibly fibroadenomas) [facility]): Biopsy: Intermediate grade DCIS ER 95%, PR 95% (right breast masses biopsy 04/07/2022: Benign)   05/12/2022 Surgery   Left lumpectomy with Dr. Jennet Maduro: Intermediate grade DCIS, margins negative, ER 95%, PR 95%      MEDICAL HISTORY:  No past medical history on file.  SURGICAL HISTORY: Past Surgical History:  Procedure Laterality Date   MYOMECTOMY  2010   PARTIAL HYSTERECTOMY  2010    SOCIAL HISTORY: Social History   Socioeconomic History   Marital status: Single    Spouse name: Not on file   Number of children: Not on file   Years of education: Not on file   Highest education level: Not on file  Occupational History   Not on file  Tobacco Use   Smoking status: Never   Smokeless tobacco: Never  Substance and  Sexual Activity   Alcohol use: No   Drug use: No   Sexual activity: Not on file  Other Topics Concern   Not on file  Social History Narrative   Not on file   Social Determinants of Health   Financial Resource Strain: Not on file  Food Insecurity: No Food Insecurity (05/31/2022)   Hunger Vital Sign    Worried About Running Out of Food in the Last Year: Never true    Ran Out of Food in the Last Year: Never true  Transportation Needs: Not on file  Physical Activity: Not on file  Stress: Not on file  Social Connections: Not on file  Intimate Partner Violence: Not At Risk (05/31/2022)   Humiliation, Afraid, Rape, and Kick questionnaire    Fear of Current or Ex-Partner: No    Emotionally Abused: No    Physically Abused: No    Sexually Abused: No    FAMILY HISTORY: No family history on file.  ALLERGIES:  has No Known Allergies.  MEDICATIONS:  No current outpatient medications on file.   No current facility-administered medications for this visit.    REVIEW OF SYSTEMS:   Constitutional: Denies fevers, chills or abnormal night sweats   All other systems were reviewed with the patient and are negative.  PHYSICAL EXAMINATION: ECOG PERFORMANCE STATUS: 1 - Symptomatic but completely ambulatory  Vitals:   06/16/22 1307  BP: 103/64  Pulse: 62  Temp: 97.9 F (36.6 C)  SpO2: 100%   Filed Weights   06/16/22 1307  Weight: 145 lb 9.6 oz (66  kg)    GENERAL:alert, no distress and comfortable    LABORATORY DATA:  I have reviewed the data as listed Lab Results  Component Value Date   WBC 7.3 10/20/2009   HGB 9.8 (L) 10/20/2009   HCT 28.9 (L) 10/20/2009   MCV 82.7 10/20/2009   PLT 206 10/20/2009      RADIOGRAPHIC STUDIES: I have personally reviewed the radiological reports and agreed with the findings in the report.  ASSESSMENT AND PLAN:  Ductal carcinoma in situ (DCIS) of left breast 03/10/2022: Screening mammogram detected left breast mass and calcifications 5 mm  and 3 indeterminate masses (possibly fibroadenomas) [facility]): Biopsy: Intermediate grade DCIS ER 95%, PR 95% (right breast masses biopsy 04/07/2022: Benign)  05/12/2022: Left lumpectomy with Dr. Jennet Maduro: Intermediate grade DCIS, margins negative, ER 95%, PR 95%  Pathology counseling: I discussed the final pathology report of the patient provided  a copy of this report. I discussed the margins.  We also discussed the final staging along with previously performed ER/PR testing.  Treatment plan: Adjuvant radiation therapy with Dr. Roselind Messier Adjuvant antiestrogen therapy with tamoxifen once daily x 5 years Patient had prior hysterectomy. Patient is unsure about whether or not she wants to take tamoxifen.  She will think about it and will inform us of her decision after radiation is completed Return to clinic after radiation is complete  All questions were answered. The patient knows to call the clinic with any problems, questions or concerns.    Tamsen Meek, MD 06/16/22

## 2022-06-17 ENCOUNTER — Ambulatory Visit
Admission: RE | Admit: 2022-06-17 | Discharge: 2022-06-17 | Disposition: A | Discharge status: Home / Self Care | Payer: Managed Care, Other (non HMO) | Source: Ambulatory Visit | Attending: Radiation Oncology | Admitting: Radiation Oncology

## 2022-06-17 ENCOUNTER — Other Ambulatory Visit: Payer: Self-pay

## 2022-06-17 ENCOUNTER — Telehealth: Payer: Self-pay | Admitting: Hematology and Oncology

## 2022-06-17 DIAGNOSIS — Z510 Encounter for antineoplastic radiation therapy: Secondary | ICD-10-CM | POA: Diagnosis not present

## 2022-06-17 LAB — RAD ONC ARIA SESSION SUMMARY
Course Elapsed Days: 1
Plan Fractions Treated to Date: 2
Plan Prescribed Dose Per Fraction: 2.67 Gy
Plan Total Fractions Prescribed: 15
Plan Total Prescribed Dose: 40.05 Gy
Reference Point Dosage Given to Date: 5.34 Gy
Reference Point Session Dosage Given: 2.67 Gy
Session Number: 2

## 2022-06-17 NOTE — Telephone Encounter (Signed)
Scheduled appointment per 5/23 los. Patient is aware of the made appointment. 

## 2022-06-21 ENCOUNTER — Ambulatory Visit
Admission: RE | Admit: 2022-06-21 | Discharge: 2022-06-21 | Disposition: A | Discharge status: Home / Self Care | Payer: Managed Care, Other (non HMO) | Source: Ambulatory Visit | Attending: Radiation Oncology | Admitting: Radiation Oncology

## 2022-06-21 ENCOUNTER — Other Ambulatory Visit: Payer: Self-pay

## 2022-06-21 DIAGNOSIS — D0512 Intraductal carcinoma in situ of left breast: Secondary | ICD-10-CM

## 2022-06-21 DIAGNOSIS — Z510 Encounter for antineoplastic radiation therapy: Secondary | ICD-10-CM | POA: Diagnosis not present

## 2022-06-21 LAB — RAD ONC ARIA SESSION SUMMARY
Course Elapsed Days: 5
Plan Fractions Treated to Date: 3
Plan Prescribed Dose Per Fraction: 2.67 Gy
Plan Total Fractions Prescribed: 15
Plan Total Prescribed Dose: 40.05 Gy
Reference Point Dosage Given to Date: 8.01 Gy
Reference Point Session Dosage Given: 2.67 Gy
Session Number: 3

## 2022-06-21 MED ORDER — RADIAPLEXRX EX GEL: Freq: Once | CUTANEOUS | Status: AC

## 2022-06-22 ENCOUNTER — Other Ambulatory Visit: Payer: Self-pay

## 2022-06-22 ENCOUNTER — Ambulatory Visit
Admission: RE | Admit: 2022-06-22 | Discharge: 2022-06-22 | Disposition: A | Discharge status: Home / Self Care | Payer: Managed Care, Other (non HMO) | Source: Ambulatory Visit | Attending: Radiation Oncology | Admitting: Radiation Oncology

## 2022-06-22 DIAGNOSIS — Z510 Encounter for antineoplastic radiation therapy: Secondary | ICD-10-CM | POA: Diagnosis not present

## 2022-06-22 LAB — RAD ONC ARIA SESSION SUMMARY
Course Elapsed Days: 6
Plan Fractions Treated to Date: 4
Plan Prescribed Dose Per Fraction: 2.67 Gy
Plan Total Fractions Prescribed: 15
Plan Total Prescribed Dose: 40.05 Gy
Reference Point Dosage Given to Date: 10.68 Gy
Reference Point Session Dosage Given: 2.67 Gy
Session Number: 4

## 2022-06-23 ENCOUNTER — Other Ambulatory Visit: Payer: Self-pay

## 2022-06-23 ENCOUNTER — Ambulatory Visit
Admission: RE | Admit: 2022-06-23 | Discharge: 2022-06-23 | Disposition: A | Discharge status: Home / Self Care | Payer: Managed Care, Other (non HMO) | Source: Ambulatory Visit | Attending: Radiation Oncology | Admitting: Radiation Oncology

## 2022-06-23 DIAGNOSIS — Z510 Encounter for antineoplastic radiation therapy: Secondary | ICD-10-CM | POA: Diagnosis not present

## 2022-06-23 LAB — RAD ONC ARIA SESSION SUMMARY
Course Elapsed Days: 7
Plan Fractions Treated to Date: 5
Plan Prescribed Dose Per Fraction: 2.67 Gy
Plan Total Fractions Prescribed: 15
Plan Total Prescribed Dose: 40.05 Gy
Reference Point Dosage Given to Date: 13.35 Gy
Reference Point Session Dosage Given: 2.67 Gy
Session Number: 5

## 2022-06-24 ENCOUNTER — Other Ambulatory Visit: Payer: Self-pay

## 2022-06-24 ENCOUNTER — Ambulatory Visit
Admission: RE | Admit: 2022-06-24 | Discharge: 2022-06-24 | Disposition: A | Discharge status: Home / Self Care | Payer: Managed Care, Other (non HMO) | Source: Ambulatory Visit | Attending: Radiation Oncology | Admitting: Radiation Oncology

## 2022-06-24 DIAGNOSIS — Z510 Encounter for antineoplastic radiation therapy: Secondary | ICD-10-CM | POA: Diagnosis not present

## 2022-06-24 LAB — RAD ONC ARIA SESSION SUMMARY
Course Elapsed Days: 8
Plan Fractions Treated to Date: 6
Plan Prescribed Dose Per Fraction: 2.67 Gy
Plan Total Fractions Prescribed: 15
Plan Total Prescribed Dose: 40.05 Gy
Reference Point Dosage Given to Date: 16.02 Gy
Reference Point Session Dosage Given: 2.67 Gy
Session Number: 6

## 2022-06-26 DIAGNOSIS — D0512 Intraductal carcinoma in situ of left breast: Secondary | ICD-10-CM | POA: Insufficient documentation

## 2022-06-27 ENCOUNTER — Ambulatory Visit
Admission: RE | Admit: 2022-06-27 | Discharge: 2022-06-27 | Disposition: A | Discharge status: Home / Self Care | Payer: Managed Care, Other (non HMO) | Source: Ambulatory Visit | Attending: Radiation Oncology | Admitting: Radiation Oncology

## 2022-06-27 ENCOUNTER — Other Ambulatory Visit: Payer: Self-pay

## 2022-06-27 DIAGNOSIS — D0512 Intraductal carcinoma in situ of left breast: Secondary | ICD-10-CM | POA: Diagnosis not present

## 2022-06-27 LAB — RAD ONC ARIA SESSION SUMMARY
Course Elapsed Days: 11
Plan Fractions Treated to Date: 7
Plan Prescribed Dose Per Fraction: 2.67 Gy
Plan Total Fractions Prescribed: 15
Plan Total Prescribed Dose: 40.05 Gy
Reference Point Dosage Given to Date: 18.69 Gy
Reference Point Session Dosage Given: 2.67 Gy
Session Number: 7

## 2022-06-28 ENCOUNTER — Ambulatory Visit
Admission: RE | Admit: 2022-06-28 | Discharge: 2022-06-28 | Disposition: A | Discharge status: Home / Self Care | Payer: Managed Care, Other (non HMO) | Source: Ambulatory Visit | Attending: Radiation Oncology | Admitting: Radiation Oncology

## 2022-06-28 ENCOUNTER — Other Ambulatory Visit: Payer: Self-pay

## 2022-06-28 DIAGNOSIS — D0512 Intraductal carcinoma in situ of left breast: Secondary | ICD-10-CM | POA: Diagnosis not present

## 2022-06-28 LAB — RAD ONC ARIA SESSION SUMMARY
Course Elapsed Days: 12
Plan Fractions Treated to Date: 8
Plan Prescribed Dose Per Fraction: 2.67 Gy
Plan Total Fractions Prescribed: 15
Plan Total Prescribed Dose: 40.05 Gy
Reference Point Dosage Given to Date: 21.36 Gy
Reference Point Session Dosage Given: 2.67 Gy
Session Number: 8

## 2022-06-29 ENCOUNTER — Other Ambulatory Visit: Payer: Self-pay

## 2022-06-29 ENCOUNTER — Ambulatory Visit
Admission: RE | Admit: 2022-06-29 | Discharge: 2022-06-29 | Disposition: A | Discharge status: Home / Self Care | Payer: Managed Care, Other (non HMO) | Source: Ambulatory Visit | Attending: Radiation Oncology | Admitting: Radiation Oncology

## 2022-06-29 DIAGNOSIS — D0512 Intraductal carcinoma in situ of left breast: Secondary | ICD-10-CM | POA: Diagnosis not present

## 2022-06-29 LAB — RAD ONC ARIA SESSION SUMMARY
Course Elapsed Days: 13
Plan Fractions Treated to Date: 9
Plan Prescribed Dose Per Fraction: 2.67 Gy
Plan Total Fractions Prescribed: 15
Plan Total Prescribed Dose: 40.05 Gy
Reference Point Dosage Given to Date: 24.03 Gy
Reference Point Session Dosage Given: 2.67 Gy
Session Number: 9

## 2022-06-30 ENCOUNTER — Other Ambulatory Visit: Payer: Self-pay

## 2022-06-30 ENCOUNTER — Ambulatory Visit
Admission: RE | Admit: 2022-06-30 | Discharge: 2022-06-30 | Disposition: A | Discharge status: Home / Self Care | Payer: Managed Care, Other (non HMO) | Source: Ambulatory Visit | Attending: Radiation Oncology | Admitting: Radiation Oncology

## 2022-06-30 DIAGNOSIS — D0512 Intraductal carcinoma in situ of left breast: Secondary | ICD-10-CM | POA: Diagnosis not present

## 2022-06-30 LAB — RAD ONC ARIA SESSION SUMMARY
Course Elapsed Days: 14
Plan Fractions Treated to Date: 10
Plan Prescribed Dose Per Fraction: 2.67 Gy
Plan Total Fractions Prescribed: 15
Plan Total Prescribed Dose: 40.05 Gy
Reference Point Dosage Given to Date: 26.7 Gy
Reference Point Session Dosage Given: 2.67 Gy
Session Number: 10

## 2022-07-01 ENCOUNTER — Ambulatory Visit
Admission: RE | Admit: 2022-07-01 | Discharge: 2022-07-01 | Disposition: A | Discharge status: Home / Self Care | Payer: Managed Care, Other (non HMO) | Source: Ambulatory Visit | Attending: Radiation Oncology | Admitting: Radiation Oncology

## 2022-07-01 ENCOUNTER — Other Ambulatory Visit: Payer: Self-pay

## 2022-07-01 DIAGNOSIS — D0512 Intraductal carcinoma in situ of left breast: Secondary | ICD-10-CM | POA: Diagnosis not present

## 2022-07-01 LAB — RAD ONC ARIA SESSION SUMMARY
Course Elapsed Days: 15
Plan Fractions Treated to Date: 11
Plan Prescribed Dose Per Fraction: 2.67 Gy
Plan Total Fractions Prescribed: 15
Plan Total Prescribed Dose: 40.05 Gy
Reference Point Dosage Given to Date: 29.37 Gy
Reference Point Session Dosage Given: 2.67 Gy
Session Number: 11

## 2022-07-04 ENCOUNTER — Ambulatory Visit
Admission: RE | Admit: 2022-07-04 | Discharge: 2022-07-04 | Disposition: A | Discharge status: Home / Self Care | Payer: Managed Care, Other (non HMO) | Source: Ambulatory Visit | Attending: Radiation Oncology | Admitting: Radiation Oncology

## 2022-07-04 ENCOUNTER — Other Ambulatory Visit: Payer: Self-pay

## 2022-07-04 DIAGNOSIS — D0512 Intraductal carcinoma in situ of left breast: Secondary | ICD-10-CM | POA: Diagnosis not present

## 2022-07-04 LAB — RAD ONC ARIA SESSION SUMMARY
Course Elapsed Days: 18
Plan Fractions Treated to Date: 12
Plan Prescribed Dose Per Fraction: 2.67 Gy
Plan Total Fractions Prescribed: 15
Plan Total Prescribed Dose: 40.05 Gy
Reference Point Dosage Given to Date: 32.04 Gy
Reference Point Session Dosage Given: 2.67 Gy
Session Number: 12

## 2022-07-05 ENCOUNTER — Other Ambulatory Visit: Payer: Self-pay

## 2022-07-05 ENCOUNTER — Ambulatory Visit: Payer: Managed Care, Other (non HMO) | Admitting: Radiation Oncology

## 2022-07-05 ENCOUNTER — Ambulatory Visit
Admission: RE | Admit: 2022-07-05 | Discharge: 2022-07-05 | Disposition: A | Discharge status: Home / Self Care | Payer: Managed Care, Other (non HMO) | Source: Ambulatory Visit | Attending: Radiation Oncology | Admitting: Radiation Oncology

## 2022-07-05 DIAGNOSIS — D0512 Intraductal carcinoma in situ of left breast: Secondary | ICD-10-CM | POA: Diagnosis not present

## 2022-07-05 LAB — RAD ONC ARIA SESSION SUMMARY
Course Elapsed Days: 19
Plan Fractions Treated to Date: 13
Plan Prescribed Dose Per Fraction: 2.67 Gy
Plan Total Fractions Prescribed: 15
Plan Total Prescribed Dose: 40.05 Gy
Reference Point Dosage Given to Date: 34.71 Gy
Reference Point Session Dosage Given: 2.67 Gy
Session Number: 13

## 2022-07-06 ENCOUNTER — Ambulatory Visit
Admission: RE | Admit: 2022-07-06 | Discharge: 2022-07-06 | Disposition: A | Discharge status: Home / Self Care | Payer: Managed Care, Other (non HMO) | Source: Ambulatory Visit | Attending: Radiation Oncology | Admitting: Radiation Oncology

## 2022-07-06 ENCOUNTER — Other Ambulatory Visit: Payer: Self-pay

## 2022-07-06 DIAGNOSIS — D0512 Intraductal carcinoma in situ of left breast: Secondary | ICD-10-CM | POA: Diagnosis not present

## 2022-07-06 LAB — RAD ONC ARIA SESSION SUMMARY
Course Elapsed Days: 20
Plan Fractions Treated to Date: 14
Plan Prescribed Dose Per Fraction: 2.67 Gy
Plan Total Fractions Prescribed: 15
Plan Total Prescribed Dose: 40.05 Gy
Reference Point Dosage Given to Date: 37.38 Gy
Reference Point Session Dosage Given: 2.67 Gy
Session Number: 14

## 2022-07-07 ENCOUNTER — Other Ambulatory Visit: Payer: Self-pay

## 2022-07-07 ENCOUNTER — Ambulatory Visit
Admission: RE | Admit: 2022-07-07 | Discharge: 2022-07-07 | Disposition: A | Discharge status: Home / Self Care | Payer: Managed Care, Other (non HMO) | Source: Ambulatory Visit | Attending: Radiation Oncology | Admitting: Radiation Oncology

## 2022-07-07 DIAGNOSIS — D0512 Intraductal carcinoma in situ of left breast: Secondary | ICD-10-CM | POA: Diagnosis not present

## 2022-07-07 LAB — RAD ONC ARIA SESSION SUMMARY
Course Elapsed Days: 21
Plan Fractions Treated to Date: 15
Plan Prescribed Dose Per Fraction: 2.67 Gy
Plan Total Fractions Prescribed: 15
Plan Total Prescribed Dose: 40.05 Gy
Reference Point Dosage Given to Date: 40.05 Gy
Reference Point Session Dosage Given: 2.67 Gy
Session Number: 15

## 2022-07-08 ENCOUNTER — Ambulatory Visit
Admission: RE | Admit: 2022-07-08 | Discharge: 2022-07-08 | Disposition: A | Discharge status: Home / Self Care | Payer: Managed Care, Other (non HMO) | Source: Ambulatory Visit | Attending: Radiation Oncology | Admitting: Radiation Oncology

## 2022-07-08 ENCOUNTER — Other Ambulatory Visit: Payer: Self-pay

## 2022-07-08 DIAGNOSIS — D0512 Intraductal carcinoma in situ of left breast: Secondary | ICD-10-CM | POA: Diagnosis not present

## 2022-07-08 LAB — RAD ONC ARIA SESSION SUMMARY
Course Elapsed Days: 22
Plan Fractions Treated to Date: 1
Plan Prescribed Dose Per Fraction: 2 Gy
Plan Total Fractions Prescribed: 5
Plan Total Prescribed Dose: 10 Gy
Reference Point Dosage Given to Date: 2 Gy
Reference Point Session Dosage Given: 2 Gy
Session Number: 16

## 2022-07-11 ENCOUNTER — Ambulatory Visit
Admission: RE | Admit: 2022-07-11 | Discharge: 2022-07-11 | Disposition: A | Discharge status: Home / Self Care | Payer: Managed Care, Other (non HMO) | Source: Ambulatory Visit | Attending: Radiation Oncology | Admitting: Radiation Oncology

## 2022-07-11 ENCOUNTER — Other Ambulatory Visit: Payer: Self-pay

## 2022-07-11 DIAGNOSIS — D0512 Intraductal carcinoma in situ of left breast: Secondary | ICD-10-CM | POA: Diagnosis not present

## 2022-07-11 LAB — RAD ONC ARIA SESSION SUMMARY
Course Elapsed Days: 25
Plan Fractions Treated to Date: 2
Plan Prescribed Dose Per Fraction: 2 Gy
Plan Total Fractions Prescribed: 5
Plan Total Prescribed Dose: 10 Gy
Reference Point Dosage Given to Date: 4 Gy
Reference Point Session Dosage Given: 2 Gy
Session Number: 17

## 2022-07-12 ENCOUNTER — Other Ambulatory Visit: Payer: Self-pay

## 2022-07-12 ENCOUNTER — Ambulatory Visit
Admission: RE | Admit: 2022-07-12 | Discharge: 2022-07-12 | Disposition: A | Discharge status: Home / Self Care | Payer: Managed Care, Other (non HMO) | Source: Ambulatory Visit | Attending: Radiation Oncology | Admitting: Radiation Oncology

## 2022-07-12 DIAGNOSIS — D0512 Intraductal carcinoma in situ of left breast: Secondary | ICD-10-CM | POA: Diagnosis not present

## 2022-07-12 LAB — RAD ONC ARIA SESSION SUMMARY
Course Elapsed Days: 26
Plan Fractions Treated to Date: 3
Plan Prescribed Dose Per Fraction: 2 Gy
Plan Total Fractions Prescribed: 5
Plan Total Prescribed Dose: 10 Gy
Reference Point Dosage Given to Date: 6 Gy
Reference Point Session Dosage Given: 2 Gy
Session Number: 18

## 2022-07-12 MED ORDER — RADIAPLEXRX EX GEL: Freq: Once | CUTANEOUS | Status: AC

## 2022-07-13 ENCOUNTER — Ambulatory Visit
Admission: RE | Admit: 2022-07-13 | Discharge: 2022-07-13 | Disposition: A | Discharge status: Home / Self Care | Payer: Managed Care, Other (non HMO) | Source: Ambulatory Visit | Attending: Radiation Oncology | Admitting: Radiation Oncology

## 2022-07-13 ENCOUNTER — Other Ambulatory Visit: Payer: Self-pay

## 2022-07-13 DIAGNOSIS — D0512 Intraductal carcinoma in situ of left breast: Secondary | ICD-10-CM | POA: Diagnosis not present

## 2022-07-13 LAB — RAD ONC ARIA SESSION SUMMARY
Course Elapsed Days: 27
Plan Fractions Treated to Date: 4
Plan Prescribed Dose Per Fraction: 2 Gy
Plan Total Fractions Prescribed: 5
Plan Total Prescribed Dose: 10 Gy
Reference Point Dosage Given to Date: 8 Gy
Reference Point Session Dosage Given: 2 Gy
Session Number: 19

## 2022-07-14 ENCOUNTER — Ambulatory Visit
Admission: RE | Admit: 2022-07-14 | Discharge: 2022-07-14 | Disposition: A | Discharge status: Home / Self Care | Payer: Managed Care, Other (non HMO) | Source: Ambulatory Visit | Attending: Radiation Oncology | Admitting: Radiation Oncology

## 2022-07-14 ENCOUNTER — Other Ambulatory Visit: Payer: Self-pay

## 2022-07-14 ENCOUNTER — Inpatient Hospital Stay: Payer: Managed Care, Other (non HMO) | Admitting: Hematology and Oncology

## 2022-07-14 DIAGNOSIS — D0512 Intraductal carcinoma in situ of left breast: Secondary | ICD-10-CM | POA: Diagnosis not present

## 2022-07-14 LAB — RAD ONC ARIA SESSION SUMMARY
Course Elapsed Days: 28
Plan Fractions Treated to Date: 5
Plan Prescribed Dose Per Fraction: 2 Gy
Plan Total Fractions Prescribed: 5
Plan Total Prescribed Dose: 10 Gy
Reference Point Dosage Given to Date: 10 Gy
Reference Point Session Dosage Given: 2 Gy
Session Number: 20

## 2022-07-14 NOTE — Assessment & Plan Note (Deleted)
03/10/2022: Screening mammogram detected left breast mass and calcifications 5 mm and 3 indeterminate masses (possibly fibroadenomas) [facility]): Biopsy: Intermediate grade DCIS ER 95%, PR 95% (right breast masses biopsy 04/07/2022: Benign)   05/12/2022: Left lumpectomy with Dr. Jennet Maduro: Intermediate grade DCIS, margins negative, ER 95%, PR 95% 06/17/2022-07/14/2022: Adjuvant radiation  Treatment plan: Adjuvant antiestrogen therapy with tamoxifen once daily x 5 years We discussed the role of low-dose tamoxifen.  Return to clinic in 3 months for survivorship care plan visit

## 2022-07-15 NOTE — Radiation Completion Notes (Signed)
Patient Name: Jodi Fleming, Jodi Fleming MRN: [id number] Date of Birth: 1975-10-20 Referring Physician: [name], M.D. Date of Service: 2022-07-15 Radiation Oncologist: Arnette Schaumann, M.D. [facility]                             RADIATION ONCOLOGY END OF TREATMENT NOTE     Diagnosis: D05.12 Intraductal carcinoma in situ of left breast Intent: Curative     ==========DELIVERED PLANS==========  First Treatment Date: 2022-06-16 - Last Treatment Date: 2022-07-14   Plan Name: Breast_L_BH Site: Breast, Left Technique: 3D Mode: Photon Dose Per Fraction: 2.67 Gy Prescribed Dose (Delivered / Prescribed): 40.05 Gy / 40.05 Gy Prescribed Fxs (Delivered / Prescribed): 15 / 15   Plan Name: Brst_L_Bst_BH Site: Breast, Left Technique: 3D Mode: Photon Dose Per Fraction: 2 Gy Prescribed Dose (Delivered / Prescribed): 10 Gy / 10 Gy Prescribed Fxs (Delivered / Prescribed): 5 / 5     ==========ON TREATMENT VISIT DATES========== 2022-06-21, 2022-06-28, 2022-07-01, 2022-07-12     ==========UPCOMING VISITS==========       ==========APPENDIX - ON TREATMENT VISIT NOTES==========   See weekly On Treatment Notes in Epic for details.

## 2022-08-09 ENCOUNTER — Inpatient Hospital Stay: Payer: Managed Care, Other (non HMO) | Attending: Hematology and Oncology | Admitting: Hematology and Oncology

## 2022-08-09 NOTE — Assessment & Plan Note (Deleted)
03/10/2022: Screening mammogram detected left breast mass and calcifications 5 mm and 3 indeterminate masses (possibly fibroadenomas) [facility]): Biopsy: Intermediate grade DCIS ER 95%, PR 95% (right breast masses biopsy 04/07/2022: Benign)   05/12/2022: Left lumpectomy with Dr. Jennet Maduro: Intermediate grade DCIS, margins negative, ER 95%, PR 95% 07/14/2022: Completed adjuvant radiation  Treatment plan: Adjuvant antiestrogen therapy with tamoxifen once daily x 5 years Patient had prior hysterectomy. We discussed the role of low-dose tamoxifen.  Return to clinic in 3 months for survivorship care plan visit

## 2022-08-12 ENCOUNTER — Encounter: Payer: Self-pay | Admitting: Radiation Oncology

## 2022-08-14 NOTE — Progress Notes (Signed)
  Radiation Oncology         [phone] ________________________________  Name: Jodi Fleming MRN: [id number]  Date: 08/15/2022  DOB: 07-24-1975  End of Treatment Note  Diagnosis: The encounter diagnosis was Ductal carcinoma in situ (DCIS) of left breast.   Stage 0 (cTis (DCIS), cN0, cM0) Left Breast, Intermediate grade DCIS, ER+ / PR+ / Her2 not assessed: s/p lumpectomy      Indication for treatment: Curative        Radiation treatment dates: 06/16/22 through 07/14/22   Site/dose:  1) left breast - 40.05 Gy delivered in 15 Fx at 2.67 Gy/Fx 2) left breast boost - 10 Gy delivered in 5 Fx at 2 Gy/Fx  Technique/Mode: 3D / Photon   Beams/energy: 6X , 10X  Narrative: The patient tolerated radiation treatment relatively well. During her final weekly treatment check on 07/12/22, the patient endorsed mild fatigue, itching within treatment field, and an increase in size and hardness of her post-op seroma. Physical exam performed on that same date showed hyperpigmentation changes and swelling throughout the left breast area. Some induration superior to her lumpectomy scar consistent with lumpectomy cavity/seroma was also appreciated. No skin breakdown was appreciated otherwise.   Plan: The patient has completed radiation treatment. The patient will return to radiation oncology clinic for routine followup in one month. I advised them to call or return sooner if they have any questions or concerns related to their recovery or treatment.  -----------------------------------  Billie Lade, PhD, MD  This document serves as a record of services personally performed by Antony Blackbird, MD. It was created on his behalf by Neena Rhymes, a trained medical scribe. The creation of this record is based on the scribe's personal observations and the provider's statements to them. This document has been checked and approved by the attending provider.

## 2022-08-14 NOTE — Progress Notes (Signed)
Radiation Oncology         (336) [phone] ________________________________  Name: Jodi Fleming MRN: [id number]  Date: 08/15/2022  DOB: [birth year]  Follow-Up Visit Note  CC: Patient, No Pcp Per  [name], MD  No diagnosis found.  Diagnosis: The encounter diagnosis was Ductal carcinoma in situ (DCIS) of left breast.   Stage 0 (cTis (DCIS), cN0, cM0) Left Breast, Intermediate grade DCIS, ER+ / PR+ / Her2 not assessed: s/p lumpectomy      Interval Since Last Radiation: 1 month and 2 days   Indication for treatment: Curative       Radiation treatment dates: 06/16/22 through 07/14/22  Site/dose:  1) left breast - 40.05 Gy delivered in 15 Fx at 2.67 Gy/Fx 2) left breast boost - 10 Gy delivered in 5 Fx at 2 Gy/Fx Technique/Mode: 3D / Photon  Beams/energy: 6X , 10X  Narrative:  The patient returns today for routine follow-up. The patient tolerated radiation treatment relatively well. During her final weekly treatment check on 07/12/22, the patient endorsed mild fatigue, itching within treatment field, and an increase in size and hardness of her post-op seroma. Physical exam performed on that same date showed hyperpigmentation changes and swelling throughout the left breast area. Some induration superior to her lumpectomy scar consistent with lumpectomy cavity/seroma was also appreciated. No skin breakdown was appreciated otherwise.    On the date of her first radiation treatment (06/16/22), the patient was seen in consultation by Dr. Pamelia Hoit to discuss antiestrogen treatment options. Dr. Pamelia Hoit has recommended antiestrogen therapy consisting of tamoxifen, however the patient is unsure if this is the right option for her. She will follow up with Dr. Pamelia Hoit in the near future to further discuss her antiestrogen treatment.    ***                         Allergies:  has No Known Allergies.  Meds: No current outpatient medications on file.   No current facility-administered medications  for this encounter.    Physical Findings: The patient is in no acute distress. Patient is alert and oriented.  vitals were not taken for this visit. .  No significant changes. Lungs are clear to auscultation bilaterally. Heart has regular rate and rhythm. No palpable cervical, supraclavicular, or axillary adenopathy. Abdomen soft, non-tender, normal bowel sounds.  Right Breast: no palpable mass, nipple discharge or bleeding. Left Breast: ***  Lab Findings: Lab Results  Component Value Date   WBC 3.6 (L) 06/16/2022   HGB 12.2 06/16/2022   HCT 37.0 06/16/2022   MCV 88.9 06/16/2022   PLT 270 06/16/2022    Radiographic Findings: No results found.  Impression: The encounter diagnosis was Ductal carcinoma in situ (DCIS) of left breast.   Stage 0 (cTis (DCIS), cN0, cM0) Left Breast, Intermediate grade DCIS, ER+ / PR+ / Her2 not assessed: s/p lumpectomy      The patient is recovering from the effects of radiation.  ***  Plan:  ***   *** minutes of total time was spent for this patient encounter, including preparation, face-to-face counseling with the patient and coordination of care, physical exam, and documentation of the encounter. ____________________________________  Billie Lade, PhD, MD  This document serves as a record of services personally performed by Antony Blackbird, MD. It was created on his behalf by Neena Rhymes, a trained medical scribe. The creation of this record is based on the scribe's personal observations and the provider's statements to  them. This document has been checked and approved by the attending provider.

## 2022-08-15 ENCOUNTER — Other Ambulatory Visit: Payer: Self-pay

## 2022-08-15 ENCOUNTER — Ambulatory Visit
Admission: RE | Admit: 2022-08-15 | Discharge: 2022-08-15 | Disposition: A | Discharge status: Home / Self Care | Payer: Managed Care, Other (non HMO) | Source: Ambulatory Visit | Attending: Radiation Oncology | Admitting: Radiation Oncology

## 2022-08-15 VITALS — BP 102/67 | HR 78 | Temp 97.3°F | Resp 18 | Ht 66.0 in | Wt 138.4 lb

## 2022-08-15 DIAGNOSIS — Z923 Personal history of irradiation: Secondary | ICD-10-CM | POA: Diagnosis not present

## 2022-08-15 DIAGNOSIS — Z170 Estrogen receptor positive status [ER+]: Secondary | ICD-10-CM | POA: Insufficient documentation

## 2022-08-15 DIAGNOSIS — D0512 Intraductal carcinoma in situ of left breast: Secondary | ICD-10-CM | POA: Insufficient documentation

## 2022-08-15 HISTORY — DX: Personal history of irradiation: Z92.3

## 2022-08-15 NOTE — Progress Notes (Signed)
Jodi Fleming is here today for follow up post radiation to the breast.   Breast Side:Left Breast   They completed their radiation on: 07/14/2022 Does the patient complain of any of the following: Post radiation skin issues: No Breast Tenderness: No Breast Swelling: No Lymphadema: No Range of Motion limitations: No issues Fatigue post radiation: No Appetite good/fair/poor: Good   BP 102/67 (BP Location: Left Arm, Patient Position: Sitting)   Pulse 78   Temp (!) 97.3 F (36.3 C) (Temporal)   Resp 18   Ht 5\' 6"  (1.676 m)   Wt 138 lb 6 oz (62.8 kg)   SpO2 100%   BMI 22.33 kg/m

## 2022-08-15 NOTE — Addendum Note (Signed)
Encounter addended by: Rana Snare, LPN on: 1/61/0960 1:33 PM  Actions taken: Charge Capture section accepted

## 2023-02-14 NOTE — Progress Notes (Incomplete)
  Radiation Oncology         [phone] ________________________________  Name: Jodi Fleming MRN: [id number]  Date: 02/16/2023  DOB: 01/15/1976  Follow-Up Visit Note  CC: Patient, No Pcp Per  No ref. provider found  No diagnosis found.  Diagnosis:  The encounter diagnosis was Ductal carcinoma in situ (DCIS) of left breast.   Stage 0 (cTis (DCIS), cN0, cM0) Left Breast, Intermediate grade DCIS, ER+ / PR+ / Her2 not assessed: s/p lumpectomy   Indication for treatment: Curative   Interval Since Last Radiation:  7 months 4 days  Radiation treatment dates: 06/16/22 through 07/14/22   Site/Dose/Technique/Mode:  1) left breast - 40.05 Gy delivered in 15 Fx at 2.67 Gy/Fx 2) left breast boost - 10 Gy delivered in 5 Fx at 2 Gy/Fx Technique/Mode: 3D / Photon  Beams/energy: 6X , 10X  Narrative:  The patient returns today for routine follow-up. She was last seen here on 08-15-2022 for a routine 1 month follow-up. No other significant oncologic interval history since the patient was last seen. She continues to follow up with her PCP, Dr. Virl Son, most recent visit on 11-09-22. At that time she denied  any symptoms or concerns indication disease recurrence and was noted as NED on examination.              Allergies:  is allergic to shellfish allergy.  Meds: No current outpatient medications on file.   No current facility-administered medications for this encounter.    Physical Findings: The patient is in no acute distress. Patient is alert and oriented.  vitals were not taken for this visit. .  No significant changes. Lungs are clear to auscultation bilaterally. Heart has regular rate and rhythm. No palpable cervical, supraclavicular, or axillary adenopathy. Abdomen soft, non-tender, normal bowel sounds.   Lab Findings: Lab Results  Component Value Date   WBC 3.6 (L) 06/16/2022   HGB 12.2 06/16/2022   HCT 37.0 06/16/2022   MCV 88.9 06/16/2022   PLT 270 06/16/2022     Radiographic Findings: No results found.  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  ***  Plan:  ***   *** minutes of total time was spent for this patient encounter, including preparation, face-to-face counseling with the patient and coordination of care, physical exam, and documentation of the encounter. ____________________________________  Billie Lade, PhD, MD  This document serves as a record of services personally performed by Antony Blackbird, MD. It was created on his behalf by Herbie Saxon, a trained medical scribe. The creation of this record is based on the scribe's personal observations and the provider's statements to them. This document has been checked and approved by the attending provider.

## 2023-02-15 ENCOUNTER — Telehealth: Payer: Self-pay | Admitting: *Deleted

## 2023-02-15 NOTE — Telephone Encounter (Signed)
CALLED PATIENT TO INFORM THAT FU HAS BEEN MOVED TO 11:30 AM ON 02-16-23 DUE TO DR. KINARD BEING IN THE OR, LVM FOR A RETURN CALL

## 2023-02-16 ENCOUNTER — Ambulatory Visit
Admission: RE | Admit: 2023-02-16 | Discharge: 2023-02-16 | Disposition: A | Discharge status: Home / Self Care | Payer: Managed Care, Other (non HMO) | Source: Ambulatory Visit | Attending: Radiation Oncology | Admitting: Radiation Oncology

## 2023-02-17 ENCOUNTER — Telehealth: Payer: Self-pay | Admitting: *Deleted

## 2023-02-17 NOTE — Telephone Encounter (Signed)
CALLED PATIENT TO ASK ABOUT RESCHEDULING MISSED FU APPT. FOR 02-16-23, LVM FOR A RETURN CALL

## 2023-08-07 ENCOUNTER — Ambulatory Visit
Admission: EM | Admit: 2023-08-07 | Discharge: 2023-08-07 | Disposition: A | Discharge status: Home / Self Care | Attending: Family Medicine | Admitting: Family Medicine

## 2023-08-07 DIAGNOSIS — L509 Urticaria, unspecified: Secondary | ICD-10-CM | POA: Diagnosis not present

## 2023-08-07 DIAGNOSIS — H02846 Edema of left eye, unspecified eyelid: Secondary | ICD-10-CM | POA: Diagnosis not present

## 2023-08-07 MED ORDER — HYDROXYZINE HCL 25 MG PO TABS: 12.5000 mg | ORAL_TABLET | Freq: Three times a day (TID) | ORAL | 0 refills | Status: AC | PRN

## 2023-08-07 MED ORDER — PREDNISONE 20 MG PO TABS: ORAL_TABLET | ORAL | 0 refills | Status: AC

## 2023-08-07 NOTE — ED Provider Notes (Signed)
 [facility]  Note:  This document was prepared using Conservation officer, historic buildings and may include unintentional dictation errors.  MRN: [id number] DOB: 07/08/1975  Subjective:   Jodi Fleming is a 48 y.o. female presenting for 3 to 4-week history of persistent hives, swelling of her different areas of her body.  Patient was seen by her PCP twice already.  She had extensive workup.  Is scheduled for follow-up 08/17/2023.  No fever, pain, drainage of pus or bleeding, joint pains, sick symptoms.  Denies eating any new foods, starting new medications, exposure to poisonous plants, new hygiene products, new cleaning products or detergents.   No current facility-administered medications for this encounter. No current outpatient medications on file.   Allergies  Allergen Reactions   Shellfish Allergy Hives and Swelling   Tape     Her skin reacts to tape.    Past Medical History:  Diagnosis Date   History of radiation therapy    Left breast-06/16/22-07/14/22- Dr. Lynwood Nasuti     Past Surgical History:  Procedure Laterality Date   MYOMECTOMY  2010   PARTIAL HYSTERECTOMY  2010    History reviewed. No pertinent family history.  Social History   Tobacco Use   Smoking status: Never   Smokeless tobacco: Never  Substance Use Topics   Alcohol use: No   Drug use: No    ROS   Objective:   Vitals: BP 108/74 (BP Location: Right Arm)   Pulse 70   Temp 98 F (36.7 C) (Oral)   Resp 16   SpO2 98%   Physical Exam Constitutional:      General: She is not in acute distress.    Appearance: Normal appearance. She is well-developed and normal weight. She is not ill-appearing, toxic-appearing or diaphoretic.  HENT:     Head: Normocephalic and atraumatic.     Right Ear: Tympanic membrane, ear canal and external ear normal. No drainage or tenderness. No middle ear effusion. There is no impacted cerumen. Tympanic membrane is not erythematous or bulging.      Left Ear: Tympanic membrane, ear canal and external ear normal. No drainage or tenderness.  No middle ear effusion. There is no impacted cerumen. Tympanic membrane is not erythematous or bulging.     Nose: Nose normal. No congestion or rhinorrhea.     Mouth/Throat:     Mouth: Mucous membranes are moist. No oral lesions.     Pharynx: No pharyngeal swelling, oropharyngeal exudate, posterior oropharyngeal erythema or uvula swelling.     Tonsils: No tonsillar exudate or tonsillar abscesses.  Eyes:     General: Lids are everted, no foreign bodies appreciated. Vision grossly intact. No scleral icterus.       Right eye: No foreign body, discharge or hordeolum.        Left eye: No foreign body, discharge or hordeolum.     Extraocular Movements: Extraocular movements intact.     Right eye: Normal extraocular motion.     Left eye: Normal extraocular motion and no nystagmus.     Conjunctiva/sclera: Conjunctivae normal.     Right eye: Right conjunctiva is not injected. No chemosis, exudate or hemorrhage.    Left eye: Left conjunctiva is not injected. No chemosis, exudate or hemorrhage.  Cardiovascular:     Rate and Rhythm: Normal rate and regular rhythm.     Heart sounds: Normal heart sounds. No murmur heard.    No friction rub. No gallop.  Pulmonary:  Effort: Pulmonary effort is normal. No respiratory distress.     Breath sounds: No stridor. No wheezing, rhonchi or rales.  Chest:     Chest wall: No tenderness.  Musculoskeletal:     Cervical back: Normal range of motion and neck supple.  Lymphadenopathy:     Cervical: No cervical adenopathy.  Skin:    General: Skin is warm and dry.     Findings: Rash (irregular shaped urticarial patch extending ~3cm over the left flank side along the midline) present.  Neurological:     General: No focal deficit present.     Mental Status: She is alert and oriented to person, place, and time.  Psychiatric:        Mood and Affect: Mood normal.         Behavior: Behavior normal.     Assessment and Plan :   PDMP not reviewed this encounter.  1. Swelling of eyelid, left   2. Urticaria    Chart review, labs reviewed from her PCPs office.  Low suspicion for an infectious process.  Recommended the use of prednisone  and hydroxyzine  for her urticaria.  Monitor for and eliminate new exposures is much as possible.  Request referral to an allergist should symptoms persist following the steroid course.  Counseled patient on potential for adverse effects with medications prescribed/recommended today, ER and return-to-clinic precautions discussed, patient verbalized understanding.    Christopher Savannah, PA-C 08/07/23 1130

## 2023-08-07 NOTE — ED Triage Notes (Signed)
 Pt reports swelling in the left eye and a rash in under left breast since this morning. Pt reports she is having swelling in different part of her body x 3 weeks, she was seen by her PCP, states blood work al the values were with in normal range, except the white blood count was low, she has a follow up appt on 08/17/23.
